# Patient Record
Sex: Female | Born: 1975 | Hispanic: No | Marital: Married | Smoking: Never smoker
Health system: Southern US, Community
[De-identification: ages and names within clinical notes are randomized; demographics above are authoritative.]

## PROBLEM LIST (undated history)

## (undated) DIAGNOSIS — J45909 Unspecified asthma, uncomplicated: Secondary | ICD-10-CM

## (undated) DIAGNOSIS — O871 Deep phlebothrombosis in the puerperium: Secondary | ICD-10-CM

## (undated) DIAGNOSIS — S12100A Unspecified displaced fracture of second cervical vertebra, initial encounter for closed fracture: Secondary | ICD-10-CM

## (undated) DIAGNOSIS — M549 Dorsalgia, unspecified: Secondary | ICD-10-CM

## (undated) HISTORY — DX: Deep phlebothrombosis in the puerperium: O87.1

## (undated) HISTORY — DX: Unspecified asthma, uncomplicated: J45.909

## (undated) HISTORY — PX: NO PAST SURGERIES: SHX2092

---

## 2010-11-06 ENCOUNTER — Emergency Department (HOSPITAL_COMMUNITY): Payer: No Typology Code available for payment source

## 2010-11-06 ENCOUNTER — Inpatient Hospital Stay (HOSPITAL_COMMUNITY)
Admission: EM | Admit: 2010-11-06 | Discharge: 2010-11-13 | DRG: 552 | Disposition: A | Discharge status: Home / Self Care | Payer: No Typology Code available for payment source | Source: Ambulatory Visit | Attending: General Surgery | Admitting: General Surgery

## 2010-11-06 DIAGNOSIS — S12100A Unspecified displaced fracture of second cervical vertebra, initial encounter for closed fracture: Principal | ICD-10-CM | POA: Diagnosis present

## 2010-11-06 DIAGNOSIS — M25519 Pain in unspecified shoulder: Secondary | ICD-10-CM | POA: Diagnosis present

## 2010-11-06 DIAGNOSIS — K5900 Constipation, unspecified: Secondary | ICD-10-CM | POA: Diagnosis present

## 2010-11-06 DIAGNOSIS — Y92410 Unspecified street and highway as the place of occurrence of the external cause: Secondary | ICD-10-CM

## 2010-11-06 DIAGNOSIS — Y998 Other external cause status: Secondary | ICD-10-CM

## 2010-11-06 DIAGNOSIS — S7010XA Contusion of unspecified thigh, initial encounter: Secondary | ICD-10-CM

## 2010-11-06 DIAGNOSIS — M79609 Pain in unspecified limb: Secondary | ICD-10-CM | POA: Diagnosis present

## 2010-11-06 LAB — BASIC METABOLIC PANEL
Calcium: 9.5 mg/dL (ref 8.4–10.5)
Creatinine, Ser: 0.47 mg/dL — ABNORMAL LOW (ref 0.50–1.10)
GFR calc Af Amer: >60 mL/min (ref >60)
Sodium: 137 mEq/L (ref 135–145)

## 2010-11-06 LAB — CBC
MCH: 33.1 pg (ref 26.0–34.0)
MCV: 92.3 fL (ref 78.0–100.0)
Platelets: 304 10*3/uL (ref 150–400)
RDW: 11.3 % — ABNORMAL LOW (ref 11.5–15.5)
WBC: 15.3 10*3/uL — ABNORMAL HIGH (ref 4.0–10.5)

## 2010-11-06 LAB — DIFFERENTIAL
Basophils Relative: 0 % (ref 0–1)
Eosinophils Absolute: 0.1 10*3/uL (ref 0.0–0.7)
Eosinophils Relative: 1 % (ref 0–5)
Lymphs Abs: 0.8 10*3/uL (ref 0.7–4.0)
Monocytes Relative: 4 % (ref 3–12)

## 2010-11-06 LAB — POCT PREGNANCY, URINE: Preg Test, Ur: NEGATIVE

## 2010-11-06 LAB — MRSA PCR SCREENING: MRSA by PCR: NEGATIVE

## 2010-11-06 MED ORDER — IOHEXOL 300 MG/ML  SOLN
100.0000 mL | Freq: Once | INTRAMUSCULAR | Status: AC | PRN
  Administered 2010-11-06: 100 mL via INTRAVENOUS

## 2010-11-07 ENCOUNTER — Inpatient Hospital Stay (HOSPITAL_COMMUNITY): Payer: No Typology Code available for payment source

## 2010-11-08 ENCOUNTER — Inpatient Hospital Stay (HOSPITAL_COMMUNITY): Payer: No Typology Code available for payment source

## 2010-11-10 DIAGNOSIS — S12100A Unspecified displaced fracture of second cervical vertebra, initial encounter for closed fracture: Secondary | ICD-10-CM
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
## 2011-01-26 ENCOUNTER — Encounter: Payer: Self-pay | Admitting: *Deleted

## 2011-01-26 ENCOUNTER — Emergency Department (HOSPITAL_COMMUNITY)
Admission: EM | Admit: 2011-01-26 | Discharge: 2011-01-26 | Disposition: A | Discharge status: Home / Self Care | Payer: Medicaid Other | Source: Home / Self Care | Attending: Family Medicine | Admitting: Family Medicine

## 2011-01-26 DIAGNOSIS — T7840XA Allergy, unspecified, initial encounter: Secondary | ICD-10-CM

## 2011-01-26 MED ORDER — DIPHENHYDRAMINE HCL 50 MG/ML IJ SOLN
50.0000 mg | Freq: Once | INTRAMUSCULAR | Status: AC
  Administered 2011-01-26: 50 mg via INTRAMUSCULAR

## 2011-01-26 MED ORDER — METHYLPREDNISOLONE ACETATE 80 MG/ML IJ SUSP
INTRAMUSCULAR | Status: AC
  Filled 2011-01-26: qty 1

## 2011-01-26 MED ORDER — DIPHENHYDRAMINE HCL 25 MG PO TABS: 25.0000 mg | ORAL_TABLET | Freq: Four times a day (QID) | ORAL | Status: AC

## 2011-01-26 MED ORDER — DIPHENHYDRAMINE HCL 50 MG/ML IJ SOLN
INTRAMUSCULAR | Status: AC
  Filled 2011-01-26: qty 1

## 2011-01-26 MED ORDER — METHYLPREDNISOLONE ACETATE 40 MG/ML IJ SUSP
80.0000 mg | Freq: Once | INTRAMUSCULAR | Status: AC
  Administered 2011-01-26: 80 mg via INTRAMUSCULAR
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
## 2011-01-26 NOTE — ED Provider Notes (Addendum)
History     CSN: [id number] Arrival date & time: 01/26/2011  7:38 PM   First MD Initiated Contact with Patient 01/26/11 1815      Chief Complaint  Patient presents with  . Allergic Reaction    (Consider location/radiation/quality/duration/timing/severity/associated sxs/prior treatment) Patient is a 35 y.o. female presenting with allergic reaction. The history is provided by the patient and a relative. The history is limited by a language barrier. A language interpreter was used.  Allergic Reaction The primary symptoms are  rash and urticaria. The current episode started yesterday. The problem has not changed since onset.This is a new problem.  The rash is associated with itching.  Significant symptoms also include flushing and itching.    History reviewed. No pertinent past medical history.  History reviewed. No pertinent past surgical history.  No family history on file.  History  Substance Use Topics  . Smoking status: Not on file  . Smokeless tobacco: Not on file  . Alcohol Use: Not on file    OB History    Grav Para Term Preterm Abortions TAB SAB Ect Mult Living                  Review of Systems  Constitutional: Negative.   HENT: Negative.   Respiratory: Negative.   Cardiovascular: Negative.   Gastrointestinal: Negative.   Skin: Positive for flushing, itching and rash.    Allergies  Review of patient's allergies indicates no known allergies.  Home Medications  No current outpatient prescriptions on file.  BP 106/56  Pulse 95  Temp(Src) 98.9 F (37.2 C) (Oral)  SpO2 98%  LMP 01/12/2011  Physical Exam  Nursing note and vitals reviewed. Constitutional: She appears well-developed and well-nourished. She is cooperative.    HENT:  Head: Normocephalic.  Right Ear: External ear normal.  Left Ear: External ear normal.  Mouth/Throat: Oropharynx is clear and moist.  Eyes: Pupils are equal, round, and reactive to light.  Neck: Normal range of motion.  Neck supple.  Cardiovascular: Normal rate, regular rhythm, normal heart sounds and intact distal pulses.   Pulmonary/Chest: Effort normal and breath sounds normal.  Neurological: She is alert.  Skin: Skin is warm and dry. Rash noted. There is erythema.    ED Course  Procedures (including critical care time)  Labs Reviewed - No data to display No results found.   No diagnosis found.    MDM          Barkley Bruns, MD 01/26/11 2010  Barkley Bruns, MD 01/26/11 2011

## 2011-02-02 ENCOUNTER — Emergency Department (INDEPENDENT_AMBULATORY_CARE_PROVIDER_SITE_OTHER)
Admission: EM | Admit: 2011-02-02 | Discharge: 2011-02-02 | Disposition: A | Discharge status: Home / Self Care | Payer: Medicaid Other | Source: Home / Self Care | Attending: Emergency Medicine | Admitting: Emergency Medicine

## 2011-02-02 ENCOUNTER — Encounter: Payer: Self-pay | Admitting: Emergency Medicine

## 2011-02-02 DIAGNOSIS — K649 Unspecified hemorrhoids: Secondary | ICD-10-CM

## 2011-02-02 MED ORDER — HYDROCORTISONE ACE-PRAMOXINE 1-1 % RE CREA: TOPICAL_CREAM | Freq: Two times a day (BID) | RECTAL | Status: AC

## 2011-02-02 MED ORDER — LACTULOSE 10 GM/15ML PO SOLN: 20.0000 g | Freq: Three times a day (TID) | ORAL | Status: AC

## 2011-02-02 NOTE — ED Notes (Signed)
Pt here with c/o lower back pain and poss hemroids x 1wk  According to [employer] translation line for [place] language.pt denies constipation and last bm yesterday and normal.pt has been having ongoing rectal itching and pain with bm's.no bleeding reported or fevers.
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
## 2011-02-04 ENCOUNTER — Encounter: Payer: Self-pay | Admitting: *Deleted

## 2011-02-04 ENCOUNTER — Telehealth (HOSPITAL_COMMUNITY): Payer: Self-pay | Admitting: *Deleted

## 2011-02-07 ENCOUNTER — Telehealth (HOSPITAL_COMMUNITY): Payer: Self-pay | Admitting: *Deleted

## 2011-02-07 NOTE — ED Notes (Signed)
Dr. Lorenz Coaster notified of these telephone encounters with Sierra Blanchard from [facility] and Administrator.  He is aware there will be a delay of pt. following up with the GI doctor due to [facility] requiring referral from PCP before they will pay for the visit. [name] 02/07/2011

## 2011-02-20 ENCOUNTER — Encounter (HOSPITAL_COMMUNITY): Payer: Self-pay

## 2011-02-20 ENCOUNTER — Emergency Department (INDEPENDENT_AMBULATORY_CARE_PROVIDER_SITE_OTHER)
Admission: EM | Admit: 2011-02-20 | Discharge: 2011-02-20 | Disposition: A | Discharge status: Home / Self Care | Payer: Medicaid Other | Source: Home / Self Care | Attending: Family Medicine | Admitting: Family Medicine

## 2011-02-20 DIAGNOSIS — H1010 Acute atopic conjunctivitis, unspecified eye: Secondary | ICD-10-CM

## 2011-02-20 DIAGNOSIS — H1045 Other chronic allergic conjunctivitis: Secondary | ICD-10-CM

## 2011-02-20 MED ORDER — LORATADINE 10 MG PO TABS: 10.0000 mg | ORAL_TABLET | Freq: Every day | ORAL | Status: DC
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
## 2011-02-20 NOTE — ED Notes (Signed)
Pt states per [facility] interpreter that she has had eyes itching and pain for several months and had similar symptoms in [place] and used eye gtts.

## 2011-03-08 ENCOUNTER — Emergency Department (INDEPENDENT_AMBULATORY_CARE_PROVIDER_SITE_OTHER)
Admission: EM | Admit: 2011-03-08 | Discharge: 2011-03-08 | Disposition: A | Discharge status: Home / Self Care | Payer: Medicaid Other | Source: Home / Self Care | Attending: Family Medicine | Admitting: Family Medicine

## 2011-03-08 ENCOUNTER — Encounter (HOSPITAL_COMMUNITY): Payer: Self-pay

## 2011-03-08 DIAGNOSIS — J309 Allergic rhinitis, unspecified: Secondary | ICD-10-CM

## 2011-03-08 DIAGNOSIS — L309 Dermatitis, unspecified: Secondary | ICD-10-CM

## 2011-03-08 DIAGNOSIS — L259 Unspecified contact dermatitis, unspecified cause: Secondary | ICD-10-CM

## 2011-03-08 DIAGNOSIS — J302 Other seasonal allergic rhinitis: Secondary | ICD-10-CM

## 2011-03-08 HISTORY — DX: Dorsalgia, unspecified: M54.9

## 2011-03-08 MED ORDER — LORATADINE 10 MG PO TABS: 10.0000 mg | ORAL_TABLET | Freq: Every day | ORAL | Status: DC

## 2011-03-08 MED ORDER — TRIAMCINOLONE ACETONIDE 0.05 % EX OINT: 1.0000 "application " | TOPICAL_OINTMENT | Freq: Two times a day (BID) | CUTANEOUS | Status: DC | PRN

## 2011-03-08 NOTE — ED Notes (Signed)
Information obtained via [employer].  Mother states she is having itching to face and body for 2 days.  States she thinks it is an allergic reaction to something. Reports she had similar sx 2 months ago and sx resolved with "medication".
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
## 2011-05-30 ENCOUNTER — Encounter (HOSPITAL_COMMUNITY): Payer: Self-pay

## 2011-05-30 ENCOUNTER — Emergency Department (INDEPENDENT_AMBULATORY_CARE_PROVIDER_SITE_OTHER)
Admission: EM | Admit: 2011-05-30 | Discharge: 2011-05-30 | Disposition: A | Discharge status: Home / Self Care | Payer: Medicaid Other | Source: Home / Self Care | Attending: Emergency Medicine | Admitting: Emergency Medicine

## 2011-05-30 DIAGNOSIS — M542 Cervicalgia: Secondary | ICD-10-CM

## 2011-05-30 HISTORY — DX: Unspecified displaced fracture of second cervical vertebra, initial encounter for closed fracture: S12.100A

## 2011-05-30 MED ORDER — HYDROCODONE-ACETAMINOPHEN 5-500 MG PO TABS: 1.0000 | ORAL_TABLET | Freq: Four times a day (QID) | ORAL | Status: AC | PRN

## 2011-05-30 NOTE — ED Notes (Signed)
Limited eval, as does not speak english; phone available for translation
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
## 2011-07-29 ENCOUNTER — Emergency Department (HOSPITAL_COMMUNITY)
Admission: EM | Admit: 2011-07-29 | Discharge: 2011-07-29 | Disposition: A | Discharge status: Home / Self Care | Payer: Medicaid Other | Source: Home / Self Care | Attending: Emergency Medicine | Admitting: Emergency Medicine

## 2011-07-29 ENCOUNTER — Encounter (HOSPITAL_COMMUNITY): Payer: Self-pay | Admitting: *Deleted

## 2011-07-29 ENCOUNTER — Emergency Department (INDEPENDENT_AMBULATORY_CARE_PROVIDER_SITE_OTHER): Payer: Medicaid Other

## 2011-07-29 DIAGNOSIS — G8929 Other chronic pain: Secondary | ICD-10-CM

## 2011-07-29 DIAGNOSIS — M542 Cervicalgia: Secondary | ICD-10-CM

## 2011-07-29 DIAGNOSIS — M62838 Other muscle spasm: Secondary | ICD-10-CM

## 2011-07-29 MED ORDER — CYCLOBENZAPRINE HCL 10 MG PO TABS: 10.0000 mg | ORAL_TABLET | Freq: Three times a day (TID) | ORAL | Status: AC | PRN

## 2011-07-29 MED ORDER — MELOXICAM 15 MG PO TABS: 15.0000 mg | ORAL_TABLET | Freq: Every day | ORAL | Status: DC

## 2011-07-29 MED ORDER — HYDROCODONE-ACETAMINOPHEN 5-325 MG PO TABS: 2.0000 | ORAL_TABLET | ORAL | Status: AC | PRN
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
## 2011-07-29 NOTE — Discharge Instructions (Signed)
You will need to followup with a spine surgeon for ongoing management. Return if you've a fever above 100.4, if you get worse, or other concerns.
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
## 2011-09-01 ENCOUNTER — Emergency Department (HOSPITAL_COMMUNITY)
Admission: EM | Admit: 2011-09-01 | Discharge: 2011-09-01 | Disposition: A | Discharge status: Home / Self Care | Payer: Medicaid Other | Source: Home / Self Care | Attending: Family Medicine | Admitting: Family Medicine

## 2011-09-01 ENCOUNTER — Encounter (HOSPITAL_COMMUNITY): Payer: Self-pay

## 2011-09-01 DIAGNOSIS — R5381 Other malaise: Secondary | ICD-10-CM

## 2011-09-01 DIAGNOSIS — M542 Cervicalgia: Secondary | ICD-10-CM

## 2011-09-01 DIAGNOSIS — G8929 Other chronic pain: Secondary | ICD-10-CM

## 2011-09-01 DIAGNOSIS — R5383 Other fatigue: Secondary | ICD-10-CM

## 2011-09-01 LAB — COMPREHENSIVE METABOLIC PANEL
AST: 21 U/L (ref 0–37)
Albumin: 4.3 g/dL (ref 3.5–5.2)
BUN: 9 mg/dL (ref 6–23)
Calcium: 9.9 mg/dL (ref 8.4–10.5)
Creatinine, Ser: 0.52 mg/dL (ref 0.50–1.10)
Total Protein: 8 g/dL (ref 6.0–8.3)

## 2011-09-01 LAB — CBC
HCT: 38.3 % (ref 36.0–46.0)
MCHC: 34.7 g/dL (ref 30.0–36.0)
MCV: 92.1 fL (ref 78.0–100.0)
Platelets: 163 10*3/uL (ref 150–400)
RDW: 12.2 % (ref 11.5–15.5)

## 2011-09-01 LAB — TSH: TSH: 0.988 u[IU]/mL (ref 0.350–4.500)

## 2011-09-01 MED ORDER — IBUPROFEN 600 MG PO TABS: 600.0000 mg | ORAL_TABLET | Freq: Three times a day (TID) | ORAL | Status: AC | PRN

## 2011-09-01 MED ORDER — PRENATAL VITAMINS 28-0.8 MG PO TABS: 1.0000 | ORAL_TABLET | Freq: Every day | ORAL | Status: DC
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
## 2013-01-31 ENCOUNTER — Encounter: Payer: Self-pay | Admitting: *Deleted

## 2013-02-05 ENCOUNTER — Ambulatory Visit (INDEPENDENT_AMBULATORY_CARE_PROVIDER_SITE_OTHER): Payer: Medicaid Other | Admitting: Obstetrics and Gynecology

## 2013-02-05 ENCOUNTER — Encounter: Payer: Self-pay | Admitting: Obstetrics and Gynecology

## 2013-02-05 ENCOUNTER — Encounter: Payer: Self-pay | Admitting: *Deleted

## 2013-02-05 ENCOUNTER — Other Ambulatory Visit (HOSPITAL_COMMUNITY)
Admission: RE | Admit: 2013-02-05 | Discharge: 2013-02-05 | Disposition: A | Discharge status: Home / Self Care | Payer: Medicaid Other | Source: Ambulatory Visit | Attending: Obstetrics and Gynecology | Admitting: Obstetrics and Gynecology

## 2013-02-05 VITALS — BP 144/88 | Temp 98.1°F | Ht <= 58 in | Wt 140.0 lb

## 2013-02-05 DIAGNOSIS — O0930 Supervision of pregnancy with insufficient antenatal care, unspecified trimester: Secondary | ICD-10-CM | POA: Insufficient documentation

## 2013-02-05 DIAGNOSIS — Z113 Encounter for screening for infections with a predominantly sexual mode of transmission: Secondary | ICD-10-CM | POA: Insufficient documentation

## 2013-02-05 DIAGNOSIS — Z1151 Encounter for screening for human papillomavirus (HPV): Secondary | ICD-10-CM | POA: Insufficient documentation

## 2013-02-05 DIAGNOSIS — J45909 Unspecified asthma, uncomplicated: Secondary | ICD-10-CM | POA: Insufficient documentation

## 2013-02-05 DIAGNOSIS — Z3483 Encounter for supervision of other normal pregnancy, third trimester: Secondary | ICD-10-CM

## 2013-02-05 DIAGNOSIS — Z01419 Encounter for gynecological examination (general) (routine) without abnormal findings: Secondary | ICD-10-CM | POA: Insufficient documentation

## 2013-02-05 DIAGNOSIS — O09529 Supervision of elderly multigravida, unspecified trimester: Secondary | ICD-10-CM | POA: Insufficient documentation

## 2013-02-05 DIAGNOSIS — O0933 Supervision of pregnancy with insufficient antenatal care, third trimester: Secondary | ICD-10-CM | POA: Insufficient documentation

## 2013-02-05 DIAGNOSIS — O09523 Supervision of elderly multigravida, third trimester: Secondary | ICD-10-CM

## 2013-02-05 DIAGNOSIS — Z23 Encounter for immunization: Secondary | ICD-10-CM

## 2013-02-05 LAB — POCT URINALYSIS DIP (DEVICE)
Bilirubin Urine: NEGATIVE
Glucose, UA: NEGATIVE mg/dL
Hgb urine dipstick: NEGATIVE
Leukocytes, UA: NEGATIVE
Nitrite: NEGATIVE

## 2013-02-05 MED ORDER — TETANUS-DIPHTH-ACELL PERTUSSIS 5-2.5-18.5 LF-MCG/0.5 IM SUSP: 0.5000 mL | Freq: Once | INTRAMUSCULAR | Status: DC

## 2013-02-05 NOTE — Addendum Note (Signed)
Addended by: Faythe Casa on: 02/05/2013 11:29 AM   Modules accepted: Orders
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
## 2013-02-05 NOTE — Progress Notes (Signed)
P= 77, Used Interpreter. Given new patient information. Discussed bmi/appropriate weight gain. C/o feeling hot.

## 2013-02-05 NOTE — Addendum Note (Signed)
Addended by: Franchot Mimes on: 02/05/2013 11:06 AM   Modules accepted: Orders

## 2013-02-06 ENCOUNTER — Encounter: Payer: Self-pay | Admitting: Obstetrics and Gynecology

## 2013-02-06 LAB — PRESCRIPTION MONITORING PROFILE (19 PANEL)
Benzodiazepine Screen, Urine: NEGATIVE ng/mL
Buprenorphine, Urine: NEGATIVE ng/mL
Cocaine Metabolites: NEGATIVE ng/mL
Fentanyl, Ur: NEGATIVE ng/mL
MDMA URINE: NEGATIVE ng/mL
Meperidine, Ur: NEGATIVE ng/mL
Methaqualone: NEGATIVE ng/mL
Zolpidem, Urine: NEGATIVE ng/mL

## 2013-02-06 LAB — OBSTETRIC PANEL
Antibody Screen: NEGATIVE
Basophils Absolute: 0 10*3/uL (ref 0.0–0.1)
HCT: 32.6 % — ABNORMAL LOW (ref 36.0–46.0)
Hemoglobin: 10.9 g/dL — ABNORMAL LOW (ref 12.0–15.0)
Lymphocytes Relative: 14 % (ref 12–46)
Lymphs Abs: 1.3 10*3/uL (ref 0.7–4.0)
Monocytes Absolute: 0.5 10*3/uL (ref 0.1–1.0)
Monocytes Relative: 6 % (ref 3–12)
Neutro Abs: 6.2 10*3/uL (ref 1.7–7.7)
RBC: 3.49 MIL/uL — ABNORMAL LOW (ref 3.87–5.11)
RDW: 12.5 % (ref 11.5–15.5)
Rubella: 2.81 Index — ABNORMAL HIGH (ref <0.90)
WBC: 9.3 10*3/uL (ref 4.0–10.5)

## 2013-02-07 LAB — HEMOGLOBINOPATHY EVALUATION
Hemoglobin Other: 0 %
Hgb S Quant: 0 %

## 2013-02-08 ENCOUNTER — Other Ambulatory Visit: Payer: Self-pay | Admitting: Family Medicine

## 2013-02-08 DIAGNOSIS — O09529 Supervision of elderly multigravida, unspecified trimester: Secondary | ICD-10-CM

## 2013-02-08 LAB — CULTURE, OB URINE: Colony Count: 75000

## 2013-02-12 ENCOUNTER — Ambulatory Visit (HOSPITAL_COMMUNITY)
Admission: RE | Admit: 2013-02-12 | Discharge: 2013-02-12 | Disposition: A | Discharge status: Home / Self Care | Payer: Medicaid Other | Source: Ambulatory Visit | Attending: Family Medicine | Admitting: Family Medicine

## 2013-02-12 ENCOUNTER — Other Ambulatory Visit: Payer: Self-pay | Admitting: Family Medicine

## 2013-02-12 ENCOUNTER — Ambulatory Visit (HOSPITAL_COMMUNITY)
Admission: RE | Admit: 2013-02-12 | Discharge: 2013-02-12 | Disposition: A | Discharge status: Home / Self Care | Payer: Medicaid Other | Source: Ambulatory Visit | Attending: Obstetrics and Gynecology | Admitting: Obstetrics and Gynecology

## 2013-02-12 VITALS — BP 130/62 | HR 97 | Wt 143.0 lb

## 2013-02-12 DIAGNOSIS — O09523 Supervision of elderly multigravida, third trimester: Secondary | ICD-10-CM

## 2013-02-12 DIAGNOSIS — O0933 Supervision of pregnancy with insufficient antenatal care, third trimester: Secondary | ICD-10-CM

## 2013-02-12 DIAGNOSIS — O0930 Supervision of pregnancy with insufficient antenatal care, unspecified trimester: Secondary | ICD-10-CM | POA: Insufficient documentation

## 2013-02-12 DIAGNOSIS — O358XX0 Maternal care for other (suspected) fetal abnormality and damage, not applicable or unspecified: Secondary | ICD-10-CM | POA: Insufficient documentation

## 2013-02-12 DIAGNOSIS — Z363 Encounter for antenatal screening for malformations: Secondary | ICD-10-CM | POA: Insufficient documentation

## 2013-02-12 DIAGNOSIS — O09529 Supervision of elderly multigravida, unspecified trimester: Secondary | ICD-10-CM

## 2013-02-12 DIAGNOSIS — Z1389 Encounter for screening for other disorder: Secondary | ICD-10-CM | POA: Insufficient documentation

## 2013-02-12 NOTE — Progress Notes (Signed)
Genetic Counseling  High-Risk Gestation Note  Appointment Date:  02/12/2013 Referred By: Catalina Antigua, MD Date of Birth:  08/24/1975    Pregnancy History: [id number] Estimated Date of Delivery: 03/31/13 Estimated Gestational Age: [redacted]w[redacted]d Attending: Alpha Gula, MD   [name] was seen for genetic counseling because of a maternal age of 37 y.o..   Sport and exercise psychologist, Sierra Blanchard, provided interpretation for today's visit.   She was counseled regarding maternal age and the association with risk for chromosome conditions due to nondisjunction with aging of the ova.   We reviewed chromosomes, nondisjunction, and the associated 1 in 72 risk for fetal aneuploidy related to a maternal age of 37 y.o. at delivery. We specifically discussed Down syndrome (trisomy 80), trisomies 37 and 12, and sex chromosome aneuploidies (47,XXX and 47,XXY) including the common features and prognoses of each.   We reviewed available screening options including noninvasive prenatal screening (NIPS)/cell free fetal DNA (cffDNA) testing and detailed ultrasound.  She was counseled that screening tests are used to modify a patient's a priori risk for aneuploidy, typically based on age. This estimate provides a pregnancy specific risk assessment. We reviewed the benefits and limitations of each option. Specifically, we discussed the conditions for which each test screens, the detection rates, and false positive rates of each. She was also counseled regarding diagnostic testing via amniocentesis. We reviewed the approximate 1 in 300-500 risk for complications for amniocentesis, including spontaneous preterm labor. After consideration of all the options, she declined NIPS and amniocentesis and elected to proceed with ultrasound only.    A complete ultrasound was performed today. The ultrasound report will be sent under separate cover. There were no visualized fetal anomalies or markers suggestive of aneuploidy though  fetal anatomic survey limited by late gestational age. She understands that screening tests cannot rule out all birth defects or genetic syndromes.   Both family histories were reviewed and found to be noncontributory for birth defects, intellectual disability, and known genetic conditions. Without further information regarding the provided family history, an accurate genetic risk cannot be calculated. Further genetic counseling is warranted if more information is obtained.  [name] denied exposure to environmental toxins or chemical agents. She denied the use of alcohol, tobacco or street drugs. She denied significant viral illnesses during the course of her pregnancy. Her medical and surgical histories were noncontributory.   I counseled [name] regarding the above risks and available options.  The approximate face-to-face time with the genetic counselor was 30 minutes.  Quinn Plowman, MS,  Certified Genetic Counselor 02/12/2013

## 2013-02-12 NOTE — Progress Notes (Signed)
Sierra Blanchard  was seen today for an ultrasound appointment.  See full report in [facility].  Impression: Single IUP at 33 2/7 weeks by dates and 35 4/7 weeks by ultrasounnd today Late onset of prenatal care, advanced maternal age Somewhat limited views of the fetal anatomy obtained due to late gestational age No gross anomalies noted The estimated fetal weight today is > 90th %tile.  Uncertain whether or not this represents true fetal macrosomia or a dating issue Active fetus with BPP of 8/8 The amniotic fluid volume is subjectively decreased (AFI 8.3 cm)  Recommendations: Recommend limited ultrasound in next week for amniotic fluid volume assessement. Consider follow up ultrasuond for interval growth in 3-4 weeks if undelivered due to dating/ size discrepancy  Alpha Gula, MD

## 2013-02-18 ENCOUNTER — Encounter: Payer: Self-pay | Admitting: *Deleted

## 2013-02-19 ENCOUNTER — Ambulatory Visit (HOSPITAL_COMMUNITY)
Admission: RE | Admit: 2013-02-19 | Discharge: 2013-02-19 | Disposition: A | Discharge status: Home / Self Care | Payer: Medicaid Other | Source: Ambulatory Visit | Attending: Internal Medicine | Admitting: Internal Medicine

## 2013-02-19 ENCOUNTER — Inpatient Hospital Stay (HOSPITAL_COMMUNITY)
Admission: AD | Admit: 2013-02-19 | Discharge: 2013-02-19 | Disposition: A | Discharge status: Home / Self Care | Payer: Medicaid Other | Source: Ambulatory Visit | Attending: Obstetrics & Gynecology | Admitting: Obstetrics & Gynecology

## 2013-02-19 ENCOUNTER — Other Ambulatory Visit: Payer: Self-pay | Admitting: Advanced Practice Midwife

## 2013-02-19 ENCOUNTER — Encounter: Payer: Medicaid Other | Admitting: Family Medicine

## 2013-02-19 ENCOUNTER — Encounter: Payer: Self-pay | Admitting: Family Medicine

## 2013-02-19 ENCOUNTER — Encounter (HOSPITAL_COMMUNITY): Payer: Self-pay | Admitting: Advanced Practice Midwife

## 2013-02-19 DIAGNOSIS — O99891 Other specified diseases and conditions complicating pregnancy: Secondary | ICD-10-CM | POA: Insufficient documentation

## 2013-02-19 DIAGNOSIS — O0933 Supervision of pregnancy with insufficient antenatal care, third trimester: Secondary | ICD-10-CM

## 2013-02-19 DIAGNOSIS — O09523 Supervision of elderly multigravida, third trimester: Secondary | ICD-10-CM

## 2013-02-19 DIAGNOSIS — O4100X0 Oligohydramnios, unspecified trimester, not applicable or unspecified: Secondary | ICD-10-CM | POA: Insufficient documentation

## 2013-02-19 DIAGNOSIS — O4700 False labor before 37 completed weeks of gestation, unspecified trimester: Secondary | ICD-10-CM | POA: Insufficient documentation

## 2013-02-19 DIAGNOSIS — N949 Unspecified condition associated with female genital organs and menstrual cycle: Secondary | ICD-10-CM

## 2013-02-19 DIAGNOSIS — R1011 Right upper quadrant pain: Secondary | ICD-10-CM | POA: Insufficient documentation

## 2013-02-19 LAB — GROUP B STREP BY PCR: Group B strep by PCR: NEGATIVE

## 2013-02-19 LAB — URINALYSIS, ROUTINE W REFLEX MICROSCOPIC
Bilirubin Urine: NEGATIVE
Glucose, UA: NEGATIVE mg/dL
Ketones, ur: NEGATIVE mg/dL
pH: 7 (ref 5.0–8.0)

## 2013-02-19 NOTE — MAU Note (Signed)
Patient is sent from ultrasound due to her c/o vaginal pressure and contractions. She denies vaginal bleeding or LOF. Reports good fetal movement.
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
## 2013-02-20 ENCOUNTER — Telehealth: Payer: Self-pay | Admitting: *Deleted

## 2013-02-20 NOTE — Telephone Encounter (Addendum)
Message copied by Jill Side on Wed Feb 20, 2013 11:51 AM ------      Message from: Reva Bores      Created: Tue Feb 19, 2013  1:46 PM       Low fluid--needs to begin 2x/wk testing.   ------  Called pt with [facility] Interpreter [phone]. I informed her that her [place] yesterday shows there is low amount of fluid around the baby. We need to follow this closely and she needs clinic appt on 12/30 @ 0900.  Also, if she notices decreased fetal movement she should come to the hospital for evaluation.  Pt agreed to clinic appt and voiced understanding.

## 2013-02-20 NOTE — MAU Provider Note (Signed)
Attestation of Attending Supervision of Advanced Practitioner (PA/CNM/NP): Evaluation and management procedures were performed by the Advanced Practitioner under my supervision and collaboration.  I have reviewed the Advanced Practitioner's note and chart, and I agree with the management and plan.  Lashona Schaaf, MD, FACOG Attending Obstetrician & Gynecologist Faculty Practice, [facility]  

## 2013-02-26 ENCOUNTER — Encounter: Payer: Self-pay | Admitting: Obstetrics & Gynecology

## 2013-02-26 ENCOUNTER — Ambulatory Visit (HOSPITAL_COMMUNITY)
Admission: RE | Admit: 2013-02-26 | Discharge: 2013-02-26 | Disposition: A | Discharge status: Home / Self Care | Payer: Medicaid Other | Source: Ambulatory Visit | Attending: Obstetrics & Gynecology | Admitting: Obstetrics & Gynecology

## 2013-02-26 ENCOUNTER — Ambulatory Visit (INDEPENDENT_AMBULATORY_CARE_PROVIDER_SITE_OTHER): Payer: Medicaid Other | Admitting: Obstetrics & Gynecology

## 2013-02-26 VITALS — BP 129/76 | Wt 141.8 lb

## 2013-02-26 DIAGNOSIS — O4103X0 Oligohydramnios, third trimester, not applicable or unspecified: Secondary | ICD-10-CM

## 2013-02-26 DIAGNOSIS — O4100X0 Oligohydramnios, unspecified trimester, not applicable or unspecified: Secondary | ICD-10-CM

## 2013-02-26 LAB — POCT URINALYSIS DIP (DEVICE)
Glucose, UA: NEGATIVE mg/dL
Hgb urine dipstick: NEGATIVE
Ketones, ur: NEGATIVE mg/dL
Nitrite: NEGATIVE
Specific Gravity, Urine: 1.015 (ref 1.005–1.030)
pH: 7 (ref 5.0–8.0)

## 2013-02-26 NOTE — Progress Notes (Signed)
NST reviewed and reactive.  Last AFI 12/23 was 8.  Needs AFI today +FM, No VB, No ctx  Alejah Aristizabal L. Harraway-Smith, M.D., Evern Core

## 2013-02-26 NOTE — Patient Instructions (Signed)
Oligohydramnios °An unborn baby (fetus) lives in the mother's uterus in a sac of amniotic fluid. This fluid: °· Protects the fetus from trauma. °· Helps the fetus move freely inside the uterus. °· Helps the fetal lungs, kidneys, and digestive system develop. °· Protects the baby from infections.   °Oligohydramnios is when there is not enough amniotic fluid in the amniotic sac. If this happens early in pregnancy, a fetus might not develop normally. If this happens in the second half of a pregnancy, a fetus might not grow as much as it should and could cause problems during delivery.   °Oligohydramnios can also cause: °· Pregnancy loss (miscarriage). °· Premature birth. °· Deformities of the face or body. °· Problems with muscles and bones. °· Pressure and compression on the umbilical cord, which decreases oxygen to the fetus. °· Lung problems. °· Stillbirth. °CAUSES °A cause cannot always be found. However, possible causes include: °· A leak or a tear in the amniotic sac. °· A problem with the placenta. °· Having identical twins who share the same placenta. °· A fetal birth defect. This is usually something in the fetal kidneys or urinary tract that has not developed as it should. °· A pregnancy that goes past the due date. °· Something that affects the mother's body, such as: °· Dehydration. °· High blood pressure. °· Diabetes. °· Some medications (examples include ibuprofen and blood pressure medicines).  °· A disease that affects the skin, joints, kidneys and other organs (systemic lupus). °· Birth defects. °SYMPTOMS °· There may be no symptoms.  °· Leaking fluid from the vagina. °· Measuring smaller than usual uterus at a routine pregnancy exam. °DIAGNOSIS °To diagnose and evaluate oligohydramnios, your caregiver may: °· Order a prenatal ultrasound test. This test: °· Measures the amniotic fluid level. This will show if the amount of fluid is right for the stage of pregnancy. °· Checks the fetal  kidneys. °· Checks fetal growth. °· Evaluates the placenta. °· Confirm that you broke your water, if this is suspected by your caregiver. °· Order a nonstress test. This noninvasive test is an assessment of fetal well-being. It monitors the fetal heart rate patterns over a 20 minute period. °· Order a biophysical profile. This test measures and evaluates 5 observations of the baby (results of nonstress testing, fetal breathing, movements, muscle tone, and amount of amniotic fluid). °· Assess fetal kick counts. Tell your caregiver if the baby appears to become less active. °· Order a uterine artery doppler study. This is a type of ultrasound. It can show if enough blood and nourishment are getting to the fetus through the placenta and umbilical cord. °· Check your blood pressure. °· Check your blood sugar. °TREATMENT °Treatment will depend on how low the fluid is, how far along in the pregnancy you are, and your overall health. Treatment options include: °· Watching and waiting. You will need to be checked more often than normal. °· Increasing your fluid intake. This may be done by mouth, or you might get the fluids through the vein (intravenously, IV). °· Delivering the baby if recommended by your caregiver. °HOME CARE INSTRUCTIONS °· Only take medicine as directed by your caregiver, especially if you have a medical problem (diabetes, high blood pressure). °· Follow your caregiver's instructions regarding physical activity, especially if you have a medical problem (diabetes, high blood pressure). °· Keep all prenatal care appointments. °· Rest as much as possible. Your caregiver may put you on bed rest. °· Drink enough fluids to keep your urine clear or pale   yellow. °· Eat a healthy and nourishing diet. °· Do not smoke, drink alcohol, or take illegal drugs.  °SEEK MEDICAL CARE IF: °· You have any questions or worries about your pregnancy. °· You notice a decrease in fetal movement. °SEEK IMMEDIATE MEDICAL CARE IF:   °· Fluid comes out of your vagina. °· You start to have labor pains (contractions). °· You have a fever. °Document Released: 06/01/2010 Document Revised: 05/09/2011 Document Reviewed: 06/01/2010 °ExitCare® Patient Information ©2014 [employer]. ° °

## 2013-02-26 NOTE — Progress Notes (Signed)
p=[phone] 

## 2013-02-27 ENCOUNTER — Encounter: Payer: Medicaid Other | Admitting: Obstetrics & Gynecology

## 2013-02-28 NOTE — L&D Delivery Note (Signed)
Attestation of Attending Supervision of Advanced Practitioner (PA/CNM/NP): Evaluation and management procedures were performed by the Advanced Practitioner under my supervision and collaboration.  I have reviewed the Advanced Practitioner's note and chart, and I agree with the management and plan.  Gerome Kokesh, MD, FACOG Attending Obstetrician & Gynecologist Faculty Practice, [facility]  

## 2013-02-28 NOTE — L&D Delivery Note (Signed)
Delivery Note At 3:29 AM a viable and healthy female was delivered via  (Presentation: LOA ).  APGAR: , ; weight .   Placenta status:spontaneous and grossly intact with 3 vessel Cord:  with the following complications: terminal meconium  Anesthesia:  none Episiotomy: none Lacerations: none Suture Repair: n/a Est. Blood Loss (mL): 200  Mom to postpartum.  Baby to Couplet care / Skin to Skin.  [facility],Sierra Costanzo 03/01/2013, 3:46 AM

## 2013-03-01 ENCOUNTER — Encounter (HOSPITAL_COMMUNITY): Payer: Self-pay | Admitting: Advanced Practice Midwife

## 2013-03-01 ENCOUNTER — Inpatient Hospital Stay (HOSPITAL_COMMUNITY)
Admission: AD | Admit: 2013-03-01 | Discharge: 2013-03-02 | DRG: 774 | Disposition: A | Discharge status: Home / Self Care | Payer: Medicaid Other | Source: Ambulatory Visit | Attending: Obstetrics & Gynecology | Admitting: Obstetrics & Gynecology

## 2013-03-01 DIAGNOSIS — O09529 Supervision of elderly multigravida, unspecified trimester: Secondary | ICD-10-CM | POA: Diagnosis present

## 2013-03-01 DIAGNOSIS — Z758 Other problems related to medical facilities and other health care: Secondary | ICD-10-CM

## 2013-03-01 DIAGNOSIS — O4100X0 Oligohydramnios, unspecified trimester, not applicable or unspecified: Secondary | ICD-10-CM

## 2013-03-01 DIAGNOSIS — IMO0001 Reserved for inherently not codable concepts without codable children: Secondary | ICD-10-CM

## 2013-03-01 DIAGNOSIS — Z789 Other specified health status: Secondary | ICD-10-CM

## 2013-03-01 LAB — CBC
HCT: 31.9 % — ABNORMAL LOW (ref 36.0–46.0)
HCT: 24.5 % — ABNORMAL LOW (ref 36.0–46.0)
HEMATOCRIT: 20.6 % — AB (ref 36.0–46.0)
HEMOGLOBIN: 7 g/dL — AB (ref 12.0–15.0)
Hemoglobin: 10.8 g/dL — ABNORMAL LOW (ref 12.0–15.0)
Hemoglobin: 8.2 g/dL — ABNORMAL LOW (ref 12.0–15.0)
MCH: 30.7 pg (ref 26.0–34.0)
MCH: 30.8 pg (ref 26.0–34.0)
MCH: 31 pg (ref 26.0–34.0)
MCHC: 33.9 g/dL (ref 30.0–36.0)
MCHC: 33.5 g/dL (ref 30.0–36.0)
MCHC: 34 g/dL (ref 30.0–36.0)
MCV: 90.6 fL (ref 78.0–100.0)
MCV: 92.1 fL (ref 78.0–100.0)
MCV: 91.2 fL (ref 78.0–100.0)
Platelets: 247 10*3/uL (ref 150–400)
Platelets: 202 10*3/uL (ref 150–400)
Platelets: 170 10*3/uL (ref 150–400)
RBC: 3.52 MIL/uL — ABNORMAL LOW (ref 3.87–5.11)
RBC: 2.66 MIL/uL — AB (ref 3.87–5.11)
RBC: 2.26 MIL/uL — AB (ref 3.87–5.11)
RDW: 12.8 % (ref 11.5–15.5)
RDW: 12.8 % (ref 11.5–15.5)
RDW: 12.8 % (ref 11.5–15.5)
WBC: 11.4 10*3/uL — AB (ref 4.0–10.5)
WBC: 17.1 10*3/uL — ABNORMAL HIGH (ref 4.0–10.5)
WBC: 13.2 10*3/uL — ABNORMAL HIGH (ref 4.0–10.5)

## 2013-03-01 LAB — ABO/RH: ABO/RH(D): B POS

## 2013-03-01 LAB — DIC (DISSEMINATED INTRAVASCULAR COAGULATION)PANEL
D-Dimer, Quant: 8.33 ug/mL-FEU — ABNORMAL HIGH (ref 0.00–0.48)
INR: 1.06 (ref 0.00–1.49)
Platelets: 198 10*3/uL (ref 150–400)
Smear Review: NONE SEEN
aPTT: 24 seconds (ref 24–37)

## 2013-03-01 LAB — DIC (DISSEMINATED INTRAVASCULAR COAGULATION) PANEL
FIBRINOGEN: 306 mg/dL (ref 204–475)
PROTHROMBIN TIME: 13.6 s (ref 11.6–15.2)

## 2013-03-01 LAB — RPR: RPR: NONREACTIVE

## 2013-03-01 LAB — PREPARE RBC (CROSSMATCH)

## 2013-03-01 MED ORDER — LACTATED RINGERS IV SOLN
INTRAVENOUS | Status: DC
  Administered 2013-03-01 (×2): via INTRAVENOUS

## 2013-03-01 MED ORDER — IBUPROFEN 600 MG PO TABS
600.0000 mg | ORAL_TABLET | Freq: Four times a day (QID) | ORAL | Status: DC
  Administered 2013-03-01 – 2013-03-02 (×5): 600 mg via ORAL
  Filled 2013-03-01 (×5): qty 1

## 2013-03-01 MED ORDER — DIPHENHYDRAMINE HCL 50 MG/ML IJ SOLN: 12.5000 mg | INTRAMUSCULAR | Status: DC | PRN

## 2013-03-01 MED ORDER — ACETAMINOPHEN 325 MG PO TABS
650.0000 mg | ORAL_TABLET | ORAL | Status: DC | PRN
Start: 2013-03-01 — End: 2013-03-01

## 2013-03-01 MED ORDER — LIDOCAINE HCL (PF) 1 % IJ SOLN
30.0000 mL | INTRAMUSCULAR | Status: DC | PRN
  Filled 2013-03-01 (×2): qty 30

## 2013-03-01 MED ORDER — LANOLIN HYDROUS EX OINT: TOPICAL_OINTMENT | CUTANEOUS | Status: DC | PRN

## 2013-03-01 MED ORDER — ZOLPIDEM TARTRATE 5 MG PO TABS: 5.0000 mg | ORAL_TABLET | Freq: Every evening | ORAL | Status: DC | PRN

## 2013-03-01 MED ORDER — EPHEDRINE 5 MG/ML INJ
10.0000 mg | INTRAVENOUS | Status: DC | PRN
  Filled 2013-03-01: qty 2

## 2013-03-01 MED ORDER — LACTATED RINGERS IV SOLN: 500.0000 mL | Freq: Once | INTRAVENOUS | Status: DC

## 2013-03-01 MED ORDER — ONDANSETRON HCL 4 MG/2ML IJ SOLN: 4.0000 mg | INTRAMUSCULAR | Status: DC | PRN

## 2013-03-01 MED ORDER — CITRIC ACID-SODIUM CITRATE 334-500 MG/5ML PO SOLN: 30.0000 mL | ORAL | Status: DC | PRN

## 2013-03-01 MED ORDER — WITCH HAZEL-GLYCERIN EX PADS
1.0000 | MEDICATED_PAD | CUTANEOUS | Status: DC | PRN
Start: 2013-03-01 — End: 2013-03-02

## 2013-03-01 MED ORDER — MISOPROSTOL 200 MCG PO TABS
ORAL_TABLET | ORAL | Status: AC
  Filled 2013-03-01: qty 1

## 2013-03-01 MED ORDER — ONDANSETRON HCL 4 MG/2ML IJ SOLN: 4.0000 mg | Freq: Four times a day (QID) | INTRAMUSCULAR | Status: DC | PRN

## 2013-03-01 MED ORDER — OXYTOCIN 40 UNITS IN LACTATED RINGERS INFUSION - SIMPLE MED
62.5000 mL/h | INTRAVENOUS | Status: DC
  Filled 2013-03-01 (×2): qty 1000

## 2013-03-01 MED ORDER — PRENATAL MULTIVITAMIN CH
1.0000 | ORAL_TABLET | Freq: Every day | ORAL | Status: DC
  Administered 2013-03-02: 1 via ORAL
  Filled 2013-03-01: qty 1

## 2013-03-01 MED ORDER — PHENYLEPHRINE 40 MCG/ML (10ML) SYRINGE FOR IV PUSH (FOR BLOOD PRESSURE SUPPORT)
80.0000 ug | PREFILLED_SYRINGE | INTRAVENOUS | Status: DC | PRN
  Filled 2013-03-01: qty 2

## 2013-03-01 MED ORDER — FENTANYL 2.5 MCG/ML BUPIVACAINE 1/10 % EPIDURAL INFUSION (WH - ANES): 14.0000 mL/h | INTRAMUSCULAR | Status: DC | PRN

## 2013-03-01 MED ORDER — METHYLERGONOVINE MALEATE 0.2 MG/ML IJ SOLN
INTRAMUSCULAR | Status: AC
  Administered 2013-03-01: 0.2 mg
  Filled 2013-03-01: qty 1

## 2013-03-01 MED ORDER — DIBUCAINE 1 % RE OINT
1.0000 | TOPICAL_OINTMENT | RECTAL | Status: DC | PRN
Start: 2013-03-01 — End: 2013-03-02

## 2013-03-01 MED ORDER — OXYCODONE-ACETAMINOPHEN 5-325 MG PO TABS
1.0000 | ORAL_TABLET | ORAL | Status: DC | PRN
  Administered 2013-03-01: 1 via ORAL
  Filled 2013-03-01: qty 1

## 2013-03-01 MED ORDER — SIMETHICONE 80 MG PO CHEW
80.0000 mg | CHEWABLE_TABLET | ORAL | Status: DC | PRN
Start: 2013-03-01 — End: 2013-03-02

## 2013-03-01 MED ORDER — TETANUS-DIPHTH-ACELL PERTUSSIS 5-2.5-18.5 LF-MCG/0.5 IM SUSP: 0.5000 mL | Freq: Once | INTRAMUSCULAR | Status: DC

## 2013-03-01 MED ORDER — MISOPROSTOL 200 MCG PO TABS
ORAL_TABLET | ORAL | Status: AC
  Administered 2013-03-01: 1000 ug
  Filled 2013-03-01: qty 5

## 2013-03-01 MED ORDER — OXYCODONE-ACETAMINOPHEN 5-325 MG PO TABS: 1.0000 | ORAL_TABLET | ORAL | Status: DC | PRN

## 2013-03-01 MED ORDER — OXYTOCIN BOLUS FROM INFUSION
500.0000 mL | INTRAVENOUS | Status: DC
  Administered 2013-03-01 (×2): 500 mL via INTRAVENOUS

## 2013-03-01 MED ORDER — DIPHENHYDRAMINE HCL 25 MG PO CAPS: 25.0000 mg | ORAL_CAPSULE | Freq: Four times a day (QID) | ORAL | Status: DC | PRN

## 2013-03-01 MED ORDER — IBUPROFEN 600 MG PO TABS
600.0000 mg | ORAL_TABLET | Freq: Four times a day (QID) | ORAL | Status: DC | PRN
  Administered 2013-03-01: 600 mg via ORAL
  Filled 2013-03-01: qty 1

## 2013-03-01 MED ORDER — ONDANSETRON HCL 4 MG PO TABS
4.0000 mg | ORAL_TABLET | ORAL | Status: DC | PRN
Start: 2013-03-01 — End: 2013-03-02

## 2013-03-01 MED ORDER — BENZOCAINE-MENTHOL 20-0.5 % EX AERO: 1.0000 "application " | INHALATION_SPRAY | CUTANEOUS | Status: DC | PRN

## 2013-03-01 MED ORDER — SENNOSIDES-DOCUSATE SODIUM 8.6-50 MG PO TABS
2.0000 | ORAL_TABLET | ORAL | Status: DC
  Administered 2013-03-01: 2 via ORAL
  Filled 2013-03-01: qty 2

## 2013-03-01 MED ORDER — FENTANYL CITRATE 0.05 MG/ML IJ SOLN: 50.0000 ug | INTRAMUSCULAR | Status: DC | PRN

## 2013-03-01 MED ORDER — LACTATED RINGERS IV SOLN
500.0000 mL | INTRAVENOUS | Status: DC | PRN
  Administered 2013-03-01: 1000 mL via INTRAVENOUS

## 2013-03-01 NOTE — Progress Notes (Signed)
RN moved pt to mother baby via stretcher with additional RN assisting when pt became unresponsive, additional RNs called for assistance, rapid response called. Pt's bleeding had been small and now was noted heavy with multiple clots. Dr. Macon LargeAnyanwu, Janalyn ShyBrenda Erdy, Maggie CRNA, and Dr. Malen GauzeFoster at bedside. Postpartum hemorrhage protocol initiated.

## 2013-03-01 NOTE — Plan of Care (Signed)
Problem: Phase I Progression Outcomes Goal: IS, TCDB as ordered Outcome: Completed/Met Date Met:  03/01/13 No order nurse initiated due to NPO status, bedrest; and low grade temp @ 99.6

## 2013-03-01 NOTE — H&P (Signed)
Attestation of Attending Supervision of Advanced Practitioner (PA/CNM/NP): Evaluation and management procedures were performed by the Advanced Practitioner under my supervision and collaboration.  I have reviewed the Advanced Practitioner's note and chart, and I agree with the management and plan.  Trinitey Roache, MD, FACOG Attending Obstetrician & Gynecologist Faculty Practice, [facility]  

## 2013-03-01 NOTE — Progress Notes (Signed)
Additional RNs and Rapid Response team at bedside

## 2013-03-01 NOTE — Progress Notes (Signed)
Faculty Practice OB/GYN Attending Note  Subjective:  Called to evaluate patient with postpartum hemorrhage (PPH).   On arrival, patient was lying in a pool of blood, and several nurses were in the room helping with the patient.  Patient's daughter was helping to translate as patient had a language barrier.     Objective:  Blood pressure 135/70, pulse 80, temperature 98.2 F (36.8 C), temperature source Oral, resp. rate 18, height 4\' 10"  (1.473 m), weight 143 lb (64.864 kg), last menstrual period 06/24/2012, unknown if currently breastfeeding.  Gen: NAD Abdomen: NT, firm fundus Pelvic:  Multiple clots evacuated from lower uterine segment. EBL ~107300ml. Fundus and LUS firm after exam.  Assessment & Plan:  38 y.o. [id number] PPD#0 s/p SVD now complicated by PPH.  Methergine 0.2 mg IM given, Cytotec 1000 mcg PR x 1 placed, IV Pitocin in LR bolus infusing.  CBC and DIC panel ordered.  Second IV placed.   Type and screen sample sent.  Will continue to monitor closely, may need further intervention if bleeding continues.  Routine postpartum care, may transfer to floor if stable.   [name]  Emonie Espericueta, MD, FACOG Attending Obstetrician & Gynecologist Faculty Practice, [facility] [place]

## 2013-03-01 NOTE — Progress Notes (Signed)
Called Dr. Ike Benedom regarding patient's status. Patient tolerated well ambulating to bathroom for second time. VSS, no complaints of dizziness, fundus firm, lochia scant. Asked about foley being discontinued. May discontinue foley. Will maintain NSLs until AM. Will continue to monitor patient.

## 2013-03-01 NOTE — Progress Notes (Signed)
UR chart review completed.  
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
## 2013-03-01 NOTE — Progress Notes (Signed)
Postpartum hemorrhoage protocol in place. Blood Bank notified of type and cross needed for hold at this time. Awaiting CBC and DIC panel to be drawn. 2nd IV obtained. Anesthesia and Dr Lovett SoxAnywanwu at bedside.

## 2013-03-02 MED ORDER — IBUPROFEN 600 MG PO TABS: 600.0000 mg | ORAL_TABLET | Freq: Four times a day (QID) | ORAL | Status: DC

## 2013-03-02 MED ORDER — GUAIFENESIN 100 MG/5ML PO SOLN
5.0000 mL | ORAL | Status: DC | PRN
  Administered 2013-03-02 (×2): 100 mg via ORAL
  Filled 2013-03-02 (×2): qty 15

## 2013-03-02 MED ORDER — FERROUS SULFATE 325 (65 FE) MG PO TABS: 325.0000 mg | ORAL_TABLET | Freq: Two times a day (BID) | ORAL | Status: DC

## 2013-03-02 MED ORDER — PNEUMOCOCCAL VAC POLYVALENT 25 MCG/0.5ML IJ INJ
0.5000 mL | INJECTION | INTRAMUSCULAR | Status: AC
  Administered 2013-03-02: 0.5 mL via INTRAMUSCULAR
  Filled 2013-03-02 (×2): qty 0.5

## 2013-03-02 MED ORDER — PNEUMOCOCCAL VAC POLYVALENT 25 MCG/0.5ML IJ INJ: 0.5000 mL | INJECTION | INTRAMUSCULAR | Status: DC

## 2013-03-02 NOTE — Discharge Summary (Signed)
Obstetric Discharge Summary Reason for Admission: onset of labor and rupture of membranes Prenatal Procedures: none Intrapartum Procedures: spontaneous vaginal delivery Postpartum Procedures: none Complications-Operative and Postpartum: hemorrhage within hours of delivery while still on Birthing Suites; tx with Cytotec, Methergine and additional IV Pitocin Hemoglobin  Date Value Range Status  03/01/2013 7.0* 12.0 - 15.0 g/dL Final     HCT  Date Value Range Status  03/01/2013 20.6* 36.0 - 46.0 % Final   Sierra Blanchard is a 38yo [id number] admitted at 38.0wks in active labor/SROM on 03/01/13. She progressed to SVD within a few hours of arrival, and then proceeded to have a PPH a couple of hours post delivery. She was treated according to protocol and responded well and was tx to the Mother Baby Unit. Her Hgb beforehand was 10.8 and then approx 6 hours post-hemorrhage it was 7.0.  By PPD#1 she denies dizziness and is requesting to be discharged home. She is bottlefeeding and is unsure of contraception at this time.  Physical Exam:  General: alert, cooperative and no distress Heart: RRR Lungs: nl effort Lochia: appropriate Uterine Fundus: firm DVT Evaluation: No evidence of DVT seen on physical exam.  Discharge Diagnoses: Term Pregnancy-delivered  Discharge Information: Date: 03/02/2013 Activity: pelvic rest Diet: routine Medications: PNV, Ibuprofen and Iron Condition: stable Instructions: refer to practice specific booklet Discharge to: home Follow-up Information   [facility] Schedule an appointment as soon as possible for a visit in 4 weeks.   Contact information:   [address] [place] [id number] [phone]      Newborn Data: Live born female  Birth Weight: 7 lb 6.2 oz (3351 g) APGAR: 9, 9  Home with mother.  SHAW, KIMBERLY 03/02/2013, 7:30 AM

## 2013-03-02 NOTE — Progress Notes (Signed)
Spoke with Philipp DeputyKim Shaw CNM requesting cough syrup as patient had requested this when RN was using language line earlier. Order received. Will continue to monitor.

## 2013-03-02 NOTE — Progress Notes (Signed)
Clinical Social Work Department PSYCHOSOCIAL ASSESSMENT - MATERNAL/CHILD 03/02/2013  Patient:  CATTLEYA, DOBRATZ  Account Number:  [id number]  Admit Date:  03/01/2013  Ardine Eng Name:   Merri Ray    Clinical Social Worker:  Marki Frede, LCSW   Date/Time:  03/02/2013 02:25 PM  Date Referred:  03/02/2013   Referral source  Central Nursery     Referred reason  [facility]   Other referral source:    I:  FAMILY / HOME ENVIRONMENT Child's legal guardian:  PARENT  Guardian - Name Guardian - Age Guardian - Address  [place]  [place], [place] [address]  [place], [place]  same as above   Other household support members/support persons Other support:    II  PSYCHOSOCIAL DATA Information Source:    Occupational hygienist Employment:   Father is employed   Museum/gallery curator resources:  [employer] [place] / Grade:   Maternity Care Coordinator / Child Services Coordination / Early Interventions:  Cultural issues impacting care:    III  STRENGTHS  Strength comment:   Supportive friends that will assist the couple with transportation to go to the store and purchase the crib or playpen and carseat.  IV  RISK FACTORS AND CURRENT PROBLEMS Current Problem:    Parents don't have car seat or separate sleeping arrangements for newborn.   V  SOCIAL WORK ASSESSMENT Met with both parents.  They are married and have 3 other dependents ages 7,17, and 31.  Parents were pleasant and receptive to social work intervention.    Parents are from [place] and speaks limited [place].  Utilized interpreter services [phone]).  Subodh ID# [id number] translated.  Informed that parents arrived from [place] about 2 years ago.  Mother states that she had limited prenatal care because she did not find out about the pregnancy until she was 7 months pregnant.  She denies any hx of substance abuse or mental illness.  UDS on newborn was negative.   Parents informed of the  hospital's drug screening policy.          VI SOCIAL WORK PLAN Social Work Plan  No Further Intervention Required / No Barriers to Discharge   Type of pt/family education:   If child protective services report - county:   If child protective services report - date:   Information/referral to community resources comment:  Discussed the [facility] program and provided the family with contact information. Parents did not have a carseat or separate sleeping arrangements for newborn.  Discussed importance of neborn having separate sleeping arrangement and a car seat for discharge.  Father agreed to have someone take him to the store to purchase the items needed prior to newborn's discharge.    Other social work plan:   CSW will monitor drug screen result.    Salah Burlison J, LCSW

## 2013-03-02 NOTE — Discharge Instructions (Signed)

## 2013-03-04 LAB — TYPE AND SCREEN
ABO/RH(D): B POS
Antibody Screen: NEGATIVE
Unit division: 0
Unit division: 0

## 2013-03-04 NOTE — Discharge Summary (Signed)
Attestation of Attending Supervision of Advanced Practitioner (CNM/NP): Evaluation and management procedures were performed by the Advanced Practitioner under my supervision and collaboration.  I have reviewed the Advanced Practitioner's note and chart, and I agree with the management and plan.  Antonietta Lansdowne 03/04/2013 7:37 AM   

## 2013-03-05 ENCOUNTER — Other Ambulatory Visit: Payer: Medicaid Other

## 2013-03-05 ENCOUNTER — Encounter: Payer: Medicaid Other | Admitting: Advanced Practice Midwife

## 2013-03-05 ENCOUNTER — Ambulatory Visit (HOSPITAL_COMMUNITY): Payer: Medicaid Other

## 2013-03-13 ENCOUNTER — Encounter (HOSPITAL_COMMUNITY): Payer: Self-pay | Admitting: Emergency Medicine

## 2013-03-13 ENCOUNTER — Emergency Department (HOSPITAL_COMMUNITY)
Admission: EM | Admit: 2013-03-13 | Discharge: 2013-03-13 | Disposition: A | Discharge status: Home / Self Care | Payer: Medicaid Other | Attending: Emergency Medicine | Admitting: Emergency Medicine

## 2013-03-13 DIAGNOSIS — M549 Dorsalgia, unspecified: Secondary | ICD-10-CM | POA: Insufficient documentation

## 2013-03-13 DIAGNOSIS — I82409 Acute embolism and thrombosis of unspecified deep veins of unspecified lower extremity: Secondary | ICD-10-CM | POA: Insufficient documentation

## 2013-03-13 DIAGNOSIS — M7989 Other specified soft tissue disorders: Secondary | ICD-10-CM

## 2013-03-13 MED ORDER — RIVAROXABAN 15 MG PO TABS
15.0000 mg | ORAL_TABLET | Freq: Once | ORAL | Status: AC
  Administered 2013-03-13: 15 mg via ORAL
  Filled 2013-03-13: qty 1

## 2013-03-13 MED ORDER — XARELTO VTE STARTER PACK 15 & 20 MG PO TBPK: 15.0000 mg | ORAL_TABLET | ORAL | Status: DC

## 2013-03-13 NOTE — Discharge Instructions (Signed)
Follow up with your primary care provider(Dr. Fleet ContrasEdwin Avbuere) within 1 week. Recommend light duty as tolerated. Return to emergency department if your pain becomes worse, you having color changes to your affected leg, you develop chest pain or shortness of breath.   Resource guide provided below.    Emergency Department Resource Guide 1) Find a Doctor and Pay Out of Pocket Although you won't have to find out who is covered by your insurance plan, it is a good idea to ask around and get recommendations. You will then need to call the office and see if the doctor you have chosen will accept you as a new patient and what types of options they offer for patients who are self-pay. Some doctors offer discounts or will set up payment plans for their patients who do not have insurance, but you will need to ask so you aren't surprised when you get to your appointment.  2) Contact Your Local Health Department Not all health departments have doctors that can see patients for sick visits, but many do, so it is worth a call to see if yours does. If you don't know where your local health department is, you can check in your phone book. The CDC also has a tool to help you locate your state's health department, and many state websites also have listings of all of their local health departments.  3) Find a Walk-in Clinic If your illness is not likely to be very severe or complicated, you may want to try a walk in clinic. These are popping up all over the country in pharmacies, drugstores, and shopping centers. They're usually staffed by nurse practitioners or physician assistants that have been trained to treat common illnesses and complaints. They're usually fairly quick and inexpensive. However, if you have serious medical issues or chronic medical problems, these are probably not your best option.  No Primary Care Doctor: - Call Health Connect at  [phone] - they can help you locate a primary care doctor that   accepts your insurance, provides certain services, etc. - Physician Referral Service- [phone]  Chronic Pain Problems: Organization         Address  Phone   Notes  [facility]  [phone] Patients need to be referred by their primary care doctor.   Medication Assistance: Organization         Address  Phone   Notes  [facility] [address] [place], [place] [address] [phone]) [phone] --Must be a resident of [place] -- Must have NO insurance coverage whatsoever (no Medicaid/ Medicare, etc.) -- The pt. MUST have a primary care doctor that directs their care regularly and follows them in the community   [facility]  [phone]   [employer]  [phone]    Agencies that provide inexpensive medical care: Organization         Address  Phone   Notes  [facility]  [phone]   [facility]    [phone]) [phone]   [facility] [address] [place], [place] [address] [phone]) [phone]   [facility] [address], [place] [phone]   [facility]    [phone]) [phone]   [facility]    [phone]   [facility]  [address], [place] Phone:  [phone], Fax:  [phone] Hours of Operation:  9 am -  6 pm, M-F.  Also accepts Medicaid/Medicare and self-pay.  °[facility] ° [address], [place] Phone: [phone], Fax: [phone]. Hours of Operation:  8:30 am - 5:30 pm, M-F.  Also accepts Medicaid and self-pay.  °[facility] [place] [address], [place] Phone: ([phone]) [phone]   °[facility] [address], [place], [place] (336)[phone], Ext. 123 Mondays & Thursdays: 7-9 AM.  First 15 patients are seen on a first come, first serve basis. °  ° °Medicaid-accepting [place] Providers: ° °Organization          Address  Phone   Notes  °[facility] [address], [place] ([phone]) [phone] Also accepts self-pay patients.  °[facility] [address], [place] ° (336) [phone]   °[facility] [address], [place] (336) [phone]   °[facility] [address] [place] [address], [place] (336) [phone]   °Veita Bland [address], [place]  ° ([phone]) [phone] Only accepts [place] Access Medicaid patients after they have their name applied to their card.  ° °Self-Pay (no insurance) in [place]: ° °Organization         Address  Phone   Notes  °Sickle Cell Patients, [facility] [address], [place] (336) [phone]   °[facility] [address], [place] (336) [phone]   °[facility] ° [address], [place] (336) [phone]   °[facility]/Dr. Osei-Bonsu ° [address] [place] [address], [place] or [address], [place] ([phone]) [phone] Phone number for both [place] and [place] locations is the same.  °[facility] [address], [place] (336) [phone]   °[facility] [address] [place] [address], [place] or [address] (336) 852-7530 °(336) [phone]   °[facility] [address], [place] (336) 350-1642, phone; ([phone]) [phone], fax Sees patients 1st and 3rd Saturday of every month.  Must not qualify for public or private insurance (i.e. Medicaid, Medicare, [place] Health Choice, Veterans' Benefits) • Household income should be no more than 200% of the poverty level •The clinic cannot treat you if you are pregnant or think you are pregnant • Sexually transmitted diseases are not treated at the clinic.  ° ° °Dental Care: °Organization         Address  Phone  Notes  °[facility] [address],  [place] (336) [phone] Accepts children up to age 21 who are enrolled in Medicaid or [place] Health Choice; pregnant women with a Medicaid card; and children who have applied for Medicaid or [place] Health Choice, but were declined, whose parents can pay a reduced fee at time of service.  °[facility] [place]  [address], [place] (336) 641-7733 Accepts children up to age 21 who are enrolled in Medicaid or [place] Health Choice; pregnant women with a Medicaid card; and children who have applied for Medicaid or [place] Health Choice, but were declined, whose parents can pay a reduced fee at time of service.  °[facility] ° [address],  (336) 641-4533 Patients are seen by appointment only. Walk-ins are not accepted. [facility] will see patients 18 years of age and   older. °Monday - Tuesday (8am-5pm) °Most Wednesdays (8:30-5pm) °$30 per visit, cash only  °[facility] ° [address], [place] (336) [phone] Patients are seen by appointment only. Walk-ins are not accepted. [facility] will see patients 18 years of age and older. °One Wednesday Evening (Monthly: Volunteer Based).  $30 per visit, cash only  °[facility]  ([phone]) [phone] for adults; Children under age 4, call Graduate Pediatric Dentistry at ([phone]. Children aged 4-14, please call [phone]) [phone] to request a pediatric application. ° Dental services are provided in all areas of dental care including fillings, crowns and bridges, complete and partial dentures, implants, gum treatment, root canals, and extractions. Preventive care is also provided. Treatment is provided to both adults and children. °Patients are selected via a lottery and there is often a waiting list. °  °[facility] [address], °[place] ° (336) 763-8833 [email or web address] °  °[facility] [address], [place], [place]  (336)723-1848, Ext. 123 Second and Fourth Thursday of each month, opens at 6:30 AM; Clinic ends at 9 AM.  Patients are seen on a first-come first-served basis, and a limited number are seen during each clinic.  ° °[facility] ° [address], [place], [place] (336) [phone]   Eligibility Requirements °You must have lived in [place], [place], or [place] counties for at least the last three months. °  You cannot be eligible for state or federal sponsored healthcare insurance, including Veterans Administration, Medicaid, or Medicare. °  You generally cannot be eligible for healthcare insurance through your employer.  °  How to apply: °Eligibility screenings are held every Tuesday and Wednesday afternoon from 1:00 pm until 4:00 pm. You do not need an appointment for the interview!  °[facility] [address], [place], [place] 336-631-2330   °[facility]  336-342-8273   °[facility]  336-703-3100   °[facility]  336-570-6415   ° °Behavioral Health Resources in the Community: °Intensive Outpatient Programs °Organization         Address  Phone  Notes  °[facility] [address], [place], [place] 336-878-6098   °[facility] [address], [place], [place] 336-832-9800   °[facility] [address], [place], [place] ° 336-882-2125   °[facility] [address],  °[place], [place] 1-800-853-5163 or 336-641-4981   °Substance Abuse Resources °Organization         Address  Phone  Notes  °[facility]  336-882-2125   °[facility]  336-784-9470   °[facility]  336-285-9073   °[facility]  336-845-3988   °Residential & Outpatient Substance Abuse Program  1-800-659-3381   °Psychological Services °Organization         Address  Phone  Notes  °[facility]  336- 832-9600   °[facility]  336- 378-7881    °[facility] [address], [place] 1-800-853-5163 or 336-641-4981   ° °Mobile Crisis Teams °Organization         Address  Phone  Notes  °[facility], Mobile Crisis Care Unit  1-877-626-1772   °Assertive °[facility] ° [address] [place], [place] 336-834-9664   °Sharon DeEsch [address] °[place] 336-554-5454   ° °Self-Help/Support Groups °Organization         Address    Phone             Notes  °[facility] [place] - variety of support groups  336- 373-1402 Call for more information  °Narcotics Anonymous (NA), [facility] [address], °[place]  2 meetings at this location  ° °Residential Treatment Programs °Organization         Address  Phone  Notes  °[facility] [address],    °[place]  1-866-801-8205   °[facility] ° [address], [place], [place] 704-293-8524   °[facility] [address], [place] 336-845-3988 Admissions: 8am-3pm M-F  °[facility] [address]    °[place], [place] 336-841-1104   °[facility] [address], [place], [place] 336-379-7146   °[facility] [address]  °[place], [place] 336-285-9073   °[facility] - Intensive Outpatient [address], [place], [place] 336-852-3033   °[facility] [address]  °[place], [place] 1-877-615-2722 or 336-784-9470   °[facility] ([facility] [address] [place], [place] 336-227-7417 Accepts Medicaid  °[facility] [address]  °[place] 1-800-659-3381 Substance Abuse/Addiction Treatment  ° °[place] Behavioral Health Resources °Organization         Address  Phone  Notes  °[facility]  (888) 581-9988   °Julie Brannon, PhD [address] [place], [place]   (336) 349-5553 or (336) 951-0000   °[facility]   [address] °[place], [place] (336) 349-4454   °[facility] [address], [place], [place] (336) 342-8316 Insurance/Medicaid/sponsorship through [facility]  °[facility] [address]                                    [place], [place] (336) 342-8316 Therapy/tele-psych/case  °[facility] [address]  ° [place], [place] (336) 349-2233    °Dr. Arfeen  (336) 349-4544   °[facility] [place]  [employer] [place] [facility] 1) [address],  °2) [address], [place] °3)  [address], [place] (336) 349-3220 °(336) 342-7768 ° °(336) 342-8140   °[place] [facility] (336) 342-1394 or (336) 342-3537 (After Hours)    ° ° ° °

## 2013-03-13 NOTE — ED Notes (Signed)
Per pt and family right calf pain x 1 week with swelling. Denies injury

## 2013-03-13 NOTE — Progress Notes (Signed)
*  PRELIMINARY RESULTS* Vascular Ultrasound Right lower extremity venous duplex has been completed.  Preliminary findings: DVT involving the right popliteal vein, posterior tibial veins, and peroneal veins.    Farrel DemarkJill [place], RDMS, RVT  03/13/2013, 2:39 PM

## 2013-03-13 NOTE — ED Notes (Signed)
C/o right calf area pain & edema x 1 week. Denies injury, SOB, long trips. Reports increased pain with ambulating. Has not tried any OTC meds for pain

## 2013-03-13 NOTE — Progress Notes (Signed)
03/13/13 8:00p  Beecher McardleW. Zailey Audia RN BSN [phone] ED CM reviewed record, [facility]  Pt withheld this information. Ursula BeathJacob Gray Lacey and [facility] RN  made aware. In room to speak with patient pharmacist at bedside communicating via interpreter. Pt questioned about recent delivery and asked about breast feeding via interpreter. Pt reports she is breastfeeding, Pharmacist stressed via interpreter that patient should discontinue breastfeeding while on this medication. Pt verbalized understanding via interpreter. It was reiterated again that patient should discontinue breastfeeding while on the [facility]. Pt verbalized via interpreter that she will discontinue breastfeeding,   03/13/13 6:47pm Beecher McardleW. Nicholas Ossa RN BSN [phone] ED CM consulted to meet with patient regarding medication issue and PCP issues. Pt presented to ED with rt leg pain. Pt was diagnosed with DVT and will be started on Xerelto. Pt has [place] insurance, which covers  [employer] . Ordered placed for start pack ED pharmacist will go over [facility] with patient prior to discharge. Discussed with [facility] RN on Pod E. Pt is non [place] speaking.  Interpreter called ID #[id number], Asked patient about PCP, pt states via interpreter she does not have a PCP.   Dr. Fleet ContrasEdwin Avbuere [phone] is her [place] Medicaid assigned PCP. Pt states, she has never seen him. Explained that we will call to make appointment with Dr. Concepcion ElkAvbuere for follow-up via interpreter. Pt agrees with plan. Explained that someone will contact her from the office with an appointment at the number she provided [phone] ([phone]). Discussed the importance and benefits of a PCP and not using ED for primary care via interpreter. Pt verbalized understanding via interpreter.  ED CM will follow up with scheduling appointment i in the morning.

## 2013-03-13 NOTE — ED Notes (Signed)
Pharmacist at bedside discussing DVT regime with patient using interpreter phone.
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
## 2013-03-14 NOTE — Progress Notes (Signed)
Contacted Dr. Concepcion ElkAvbuere office [phone],  appointment made for 1/27 at 2:00 at the [address] Office. Contacted patient via interpreter at [phone]. Also, Offered [facility] patient started on  Xeralto 1/14 for rt. Leg DVT. Pt declines as per interpreter. No further ED CM needs identified.

## 2013-03-14 NOTE — ED Provider Notes (Signed)
Medical screening examination/treatment/procedure(s) were performed by non-physician practitioner and as supervising physician I was immediately available for consultation/collaboration.  EKG Interpretation   None        Toy BakerAnthony T Fard Borunda, MD 03/14/13 1556

## 2013-03-16 ENCOUNTER — Inpatient Hospital Stay (HOSPITAL_COMMUNITY)
Admission: AD | Admit: 2013-03-16 | Discharge: 2013-03-16 | Disposition: A | Discharge status: Home / Self Care | Payer: Medicaid Other | Source: Ambulatory Visit | Attending: Obstetrics & Gynecology | Admitting: Obstetrics & Gynecology

## 2013-03-16 ENCOUNTER — Encounter (HOSPITAL_COMMUNITY): Payer: Self-pay | Admitting: *Deleted

## 2013-03-16 DIAGNOSIS — I82409 Acute embolism and thrombosis of unspecified deep veins of unspecified lower extremity: Secondary | ICD-10-CM

## 2013-03-16 DIAGNOSIS — I82401 Acute embolism and thrombosis of unspecified deep veins of right lower extremity: Secondary | ICD-10-CM

## 2013-03-16 DIAGNOSIS — O871 Deep phlebothrombosis in the puerperium: Secondary | ICD-10-CM | POA: Insufficient documentation

## 2013-03-16 DIAGNOSIS — I824Z9 Acute embolism and thrombosis of unspecified deep veins of unspecified distal lower extremity: Secondary | ICD-10-CM | POA: Insufficient documentation
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
## 2013-03-16 NOTE — MAU Note (Signed)
Patient presents with right leg swelling. Delivered on January 2nd and was recently diagnosed with a DVT in her leg but states the swelling has worsened over the last week.

## 2013-03-16 NOTE — Discharge Instructions (Signed)
Deep Vein Thrombosis  A deep vein thrombosis (DVT) is a blood clot that develops in the deep, larger veins of the leg, arm, or pelvis. These are more dangerous than clots that might form in veins near the surface of the body. A DVT can lead to complications if the clot breaks off and travels in the bloodstream to the lungs.   A DVT can damage the valves in your leg veins, so that instead of flowing upward, the blood pools in the lower leg. This is called post-thrombotic syndrome, and it can result in pain, swelling, discoloration, and sores on the leg.  CAUSES  Usually, several things contribute to blood clots forming. Contributing factors include:   The flow of blood slows down.   The inside of the vein is damaged in some way.   You have a condition that makes blood clot more easily.  RISK FACTORS  Some people are more likely than others to develop blood clots. Risk factors include:    Older age, especially over 75 years of age.   Having a family history of blood clots or if you have already had a blot clot.   Having major or lengthy surgery. This is especially true for surgery on the hip, knee, or belly (abdomen). Hip surgery is particularly high risk.   Breaking a hip or leg.   Sitting or lying still for a long time. This includes long-distance travel, paralysis, or recovery from an illness or surgery.   Having cancer or cancer treatment.   Having a long, thin tube (catheter) placed inside a vein during a medical procedure.   Being overweight (obese).   Pregnancy and childbirth.   Hormone changes make the blood clot more easily during pregnancy.   The fetus puts pressure on the veins of the pelvis.   There is a risk of injury to veins during delivery or a caesarean. The risk is highest just after childbirth.   Medicines with the female hormone estrogen. This includes birth control pills and hormone replacement therapy.   Smoking.   Other circulation or heart problems.    SIGNS AND SYMPTOMS  When  a clot forms, it can either partially or totally block the blood flow in that vein. Symptoms of a DVT can include:   Swelling of the leg or arm, especially if one side is much worse.   Warmth and redness of the leg or arm, especially if one side is much worse.   Pain in an arm or leg. If the clot is in the leg, symptoms may be more noticeable or worse when standing or walking.  The symptoms of a DVT that has traveled to the lungs (pulmonary embolism, PE) usually start suddenly and include:   Shortness of breath.   Coughing.   Coughing up blood or blood-tinged phlegm.   Chest pain. The chest pain is often worse with deep breaths.   Rapid heartbeat.  Anyone with these symptoms should get emergency medical treatment right away. Call your local emergency services (911 in the [place] if you have these symptoms.  DIAGNOSIS  If a DVT is suspected, your health care provider will take a full medical history and perform a physical exam. Tests that also may be required include:   Blood tests, including studies of the clotting properties of the blood.   Ultrasonography to see if you have clots in your legs or lungs.   X-rays to show the flow of blood when dye is injected into the veins (  venography).   Studies of your lungs if you have any chest symptoms.  PREVENTION   Exercise the legs regularly. Take a brisk 30-minute walk every day.   Maintain a weight that is appropriate for your height.   Avoid sitting or lying in bed for long periods of time without moving your legs.   Women, particularly those over the age of 35 years, should consider the risks and benefits of taking estrogen medicines, including birth control pills.   Do not smoke, especially if you take estrogen medicines.   Long-distance travel can increase your risk of DVT. You should exercise your legs by walking or pumping the muscles every hour.   In-hospital prevention:   Many of the risk factors above relate to situations that exist with  hospitalization, either for illness, injury, or elective surgery.   Your health care provider will assess you for the need for venous thromboembolism prophylaxis when you are admitted to the hospital. If you are having surgery, your surgeon will assess you the day of or day after surgery.   Prevention may include medical and nonmedical measures.  TREATMENT  Once identified, a DVT can be treated. It can also be prevented in some circumstances. Once you have had a DVT, you may be at increased risk for a DVT in the future. The most common treatment for DVT is blood thinning (anticoagulant) medicine, which reduces the blood's tendency to clot. Anticoagulants can stop new blood clots from forming and stop old ones from growing. They cannot dissolve existing clots. Your body does this by itself over time. Anticoagulants can be given by mouth, by IV access, or by injection. Your health care provider will determine the best program for you. Other medicines or treatments that may be used are:   Heparin or related medicines (low molecular weight heparin) are usually the first treatment for a blood clot. They act quickly. However, they cannot be taken orally.   Heparin can cause a fall in a component of blood that stops bleeding and forms blood clots (platelets). You will be monitored with blood tests to be sure this does not occur.   Warfarin is an anticoagulant that can be swallowed. It takes a few days to start working, so usually heparin or related medicines are used in combination. Once warfarin is working, heparin is usually stopped.   Less commonly, clot dissolving drugs (thrombolytics) are used to dissolve a DVT. They carry a high risk of bleeding, so they are used mainly in severe cases, where your life or a limb is threatened.   Very rarely, a blood clot in the leg needs to be removed surgically.   If you are unable to take anticoagulants, your health care provider may arrange for you to have a filter placed  in a main vein in your abdomen. This filter prevents clots from traveling to your lungs.  HOME CARE INSTRUCTIONS   Take all medicines prescribed by your health care provider. Only take over-the-counter or prescription medicines for pain, fever, or discomfort as directed by your health care provider.   Warfarin. Most people will continue taking warfarin after hospital discharge. Your health care provider will advise you on the length of treatment (usually 3 6 months, sometimes lifelong).   Too much and too little warfarin are both dangerous. Too much warfarin increases the risk of bleeding. Too little warfarin continues to allow the risk for blood clots. While taking warfarin, you will need to have regular blood tests to measure your   blood clotting time. These blood tests usually include both the prothrombin time (PT) and international normalized ratio (INR) tests. The PT and INR results allow your health care provider to adjust your dose of warfarin. The dose can change for many reasons. It is critically important that you take warfarin exactly as prescribed, and that you have your PT and INR levels drawn exactly as directed.   Many foods, especially foods high in vitamin K, can interfere with warfarin and affect the PT and INR results. Foods high in vitamin K include spinach, kale, broccoli, cabbage, collard and turnip greens, brussel sprouts, peas, cauliflower, seaweed, and parsley as well as beef and pork liver, green tea, and soybean oil. You should eat a consistent amount of foods high in vitamin K. Avoid major changes in your diet, or notify your health care provider before changing your diet. Arrange a visit with a dietitian to answer your questions.   Many medicines can interfere with warfarin and affect the PT and INR results. You must tell your health care provider about any and all medicines you take. This includes all vitamins and supplements. Be especially cautious with aspirin and  anti-inflammatory medicines. Ask your health care provider before taking these. Do not take or discontinue any prescribed or over-the-counter medicine except on the advice of your health care provider or pharmacist.   Warfarin can have side effects, primarily excessive bruising or bleeding. You will need to hold pressure over cuts for longer than usual. Your health care provider or pharmacist will discuss other potential side effects.   Alcohol can change the body's ability to handle warfarin. It is best to avoid alcoholic drinks or consume only very small amounts while taking warfarin. Notify your health care provider if you change your alcohol intake.   Notify your dentist or other health care providers before procedures.   Activity. Ask your health care provider how soon you can go back to normal activities. It is important to stay active to prevent blood clots. If you are on anticoagulant medicine, avoid contact sports.   Exercise. It is very important to exercise. This is especially important while traveling, sitting, or standing for long periods of time. Exercise your legs by walking or by pumping the muscles frequently. Take frequent walks.   Compression stockings. These are tight elastic stockings that apply pressure to the lower legs. This pressure can help keep the blood in the legs from clotting. You may need to wear compression stockings at home to help prevent a DVT.   Do not smoke. If you smoke, quit. Ask your health care provider for help with quitting smoking.   Learn as much as you can about DVT. Knowing more about the condition should help you keep it from coming back.   Wear a medical alert bracelet or carry a medical alert card.  SEEK MEDICAL CARE IF:   You notice a rapid heartbeat.   You feel weaker or more tired than usual.   You feel faint.   You notice increased bruising.   You feel your symptoms are not getting better in the time expected.   You believe you are having side  effects of medicine.  SEEK IMMEDIATE MEDICAL CARE IF:   You have chest pain.   You have trouble breathing.   You have new or increased swelling or pain in one leg.   You cough up blood.   You notice blood in vomit, in a bowel movement, or in urine.  MAKE SURE   YOU:   Understand these instructions.   Will watch your condition.   Will get help right away if you are not doing well or get worse.  Document Released: 02/14/2005 Document Revised: 12/05/2012 Document Reviewed: 10/22/2012  ExitCare Patient Information 2014 [employer].

## 2013-03-17 NOTE — MAU Provider Note (Signed)
Attestation of Attending Supervision of Advanced Practitioner (PA/CNM/NP): Evaluation and management procedures were performed by the Advanced Practitioner under my supervision and collaboration.  I have reviewed the Advanced Practitioner's note and chart, and I agree with the management and plan.  Jayleon Mcfarlane, MD, FACOG Attending Obstetrician & Gynecologist Faculty Practice, [facility]  

## 2013-03-26 ENCOUNTER — Telehealth: Payer: Self-pay | Admitting: Obstetrics & Gynecology

## 2013-03-26 DIAGNOSIS — Z789 Other specified health status: Secondary | ICD-10-CM

## 2013-03-26 DIAGNOSIS — I82409 Acute embolism and thrombosis of unspecified deep veins of unspecified lower extremity: Secondary | ICD-10-CM

## 2013-03-26 NOTE — Telephone Encounter (Signed)
Faculty Practice OB/GYN Attending Phone Call Documentation  I received a call from a [employer] RN who visited Sierra Blanchard, a 38 y.o. Nepali-speaking [id number] s/p SVD on 03/01/13 complicated with immediate PPH and RLE DVT diagnosed on 03/13/13.  Patient has been getting treated with Xarelto and was given a starter pack which includes enough tablets for one month of therapy  (15 mg po bid -> 20 mg po qd starting on day 22 to 30); she will need a new prescription of 20 mg po qd for five additional months (needs therapy for a total of six months).  The RN reports that the patient reports she "ran out" of the medication on 03/16/13, and has not taken any medication since then.  She also has not taken her prescribed oral iron therapy for anemia after the PPH, discharge hemoglobin was 7.0.  The RN also reports that the patient desires bilateral tubal sterilization and wants to come in to the clinic to discuss this, she does not have any scheduled postpartum appointment.  RN reports that patient denies any R calf pain; she was seen for this complaint in the MAU on 03/16/13 and the instructions for Xarelto administration were again reviewed in detail.  The [employer] RN was concerned that this patient was not complaint with her prescribed medications and feels she needs to be seen in clinic soon to address these multiple issues.  Using the assistance of a Nepali phone interpreter Sierra Blanchard([phone]); the patient was called using documented phone numbers for her home and mobile phones [phone] for home and [phone] for mobile).  There was no response after multiple tries; voice mail message was left for the patient on her home phone.  Patient needs to be called and informed of importance of taking the Xarelto as prescribed to prevent further DVT/PE which could be life-threatening; she needs to take this as prescribed for six months.  It is unlikely she has run out of this medication after three days as she  was given a 30 day starter pack.  She also needs an appointment in clinic ASAP with any attending to discuss this anticoagulation recommendation and also discuss/schedule for bilateral tubal sterilization (needs to sign Medicaid papers).  As for her anemia, taking oral iron should be recommended.  This note was routed to the [facility] RN pool for further action.  [name]  [name], MD, FACOG Attending Obstetrician & Gynecologist Faculty Practice, [facility]

## 2013-03-28 ENCOUNTER — Ambulatory Visit (INDEPENDENT_AMBULATORY_CARE_PROVIDER_SITE_OTHER): Payer: Medicaid Other | Admitting: Obstetrics & Gynecology

## 2013-03-28 ENCOUNTER — Encounter: Payer: Self-pay | Admitting: Obstetrics & Gynecology

## 2013-03-28 VITALS — BP 123/73 | HR 82 | Ht <= 58 in | Wt 131.5 lb

## 2013-03-28 DIAGNOSIS — Z609 Problem related to social environment, unspecified: Secondary | ICD-10-CM

## 2013-03-28 DIAGNOSIS — D649 Anemia, unspecified: Secondary | ICD-10-CM

## 2013-03-28 DIAGNOSIS — O9081 Anemia of the puerperium: Secondary | ICD-10-CM | POA: Insufficient documentation

## 2013-03-28 DIAGNOSIS — Z302 Encounter for sterilization: Secondary | ICD-10-CM

## 2013-03-28 DIAGNOSIS — I82409 Acute embolism and thrombosis of unspecified deep veins of unspecified lower extremity: Secondary | ICD-10-CM

## 2013-03-28 DIAGNOSIS — Z789 Other specified health status: Secondary | ICD-10-CM

## 2013-03-28 MED ORDER — FERROUS SULFATE 325 (65 FE) MG PO TABS: 325.0000 mg | ORAL_TABLET | Freq: Two times a day (BID) | ORAL | Status: DC

## 2013-03-28 MED ORDER — RIVAROXABAN 15 MG PO TABS: 15.0000 mg | ORAL_TABLET | Freq: Two times a day (BID) | ORAL | Status: DC

## 2013-03-28 MED ORDER — RIVAROXABAN 20 MG PO TABS
20.0000 mg | ORAL_TABLET | Freq: Every day | ORAL | Status: DC
Start: 2013-03-28 — End: 2015-06-18

## 2013-03-28 NOTE — Progress Notes (Signed)
   POSTPARTUM VISIT NOTE  History:  38 y.o. [name] s/p SVD on 03/01/13 complicated with immediate Grade II PPH (discharge Hgb 7.0) and RLE DVT diagnosed on 03/13/13, here today for follow up.  I received a phone call on 03/26/13 from [employer] RN expressing concern that she was not taking her Xarelto as prescribed, not taking oral iron as prescribed, and that patient reports that she wants a tubal ligation. An appointment was scheduled for her today to discuss these issues.  A physical Nepali interpreter is present today.  Patient reports taking Xarelto three times a day and ran out of medications three days ago.  Unable to account for missing pills, 51 pills were in the starter pack.  She is bottle feeding, no problems with baby.  No other concerns.  Not sexually active.  The following portions of the patient's history were reviewed and updated as appropriate: allergies, current medications, past family history, past medical history, past social history, past surgical history and problem list.  Review of Systems:  Pertinent items are noted in HPI.  Objective:  Physical Exam LMP 03/13/2013 Gen: NAD Lungs: CTAB Heart: RRR Abd: Soft, nontender and nondistended Pelvic: Small uterus, NT, normal vulva and vagina Ext: Symmetric calves, NT bilaterally, no edema, no palpable cords  Assessment & Plan:  1. Postpartum deep vein thrombosis (DVT) of right lower leg I emphasized importance of taking the Xarelto as prescribed to prevent further DVT/PE which could be life-threatening; she needs to take this as prescribed for six months. It is unlikely she has run out of this medication after three days as she was given a 30 day starter pack.  She was re-prescribed 15 mg po bid x 3 weeks, then 20 mg po daily to complete six month therapy. Emphasized importance of taking medication as prescribed.  [facility] her anticoagulation.  2. Anemia, postpartum Encouraged  to take oral iron or iron rich food as recommended  3. Desires Sterilization Patient desires permanent sterilization via bilateral salpingectomy  Other reversible forms of contraception were discussed with patient; she declines all other modalities. Risks of procedure discussed with patient including but not limited to: risk of regret, permanence of method, bleeding, infection, injury to surrounding organs and need for additional procedures.  Failure risk of 1-2 % with increased risk of ectopic gestation if pregnancy occurs was also discussed with patient.  Patient verbalized understanding of these risks and wants to proceed with sterilization.  Medicaid papers signed today.   She was told that she will be contacted by our surgical scheduler regarding the time and date of her surgery; routine preoperative instructions of having nothing to eat or drink after midnight on the day prior to surgery and also coming to the hospital 1.5 hours prior to her time of surgery were also emphasized.  She was told she may be called for a preoperative appointment about a week prior to surgery and will be given further preoperative instructions at that visit. Printed patient education handouts (translated into Nepali using Google Translate) about the procedure were given to the patient to review at home.  Routine preventative health maintenance measures emphasized.   Sierra Blanchard  Sierra Amborn, MD, FACOG Attending Obstetrician & Gynecologist Faculty Practice, [facility]

## 2013-03-28 NOTE — Patient Instructions (Signed)
Patient has to take Xarelto 15mg  twice a day for three weeks (One prescription bottle) Then start the second bottle of 20 mg daily (Differnt prescription bottle with 4 refills) Has to take for a total of six months!!  This is for clot in right leg, can be life-threatening if not taken as prescribed  Iron pills can be taken twice a day.  This is for losing too much blood after delivery  Surgical scheduler will call with time and date for her surgery.   Laparoscopic BilateralTubal Removal/Laparoscopic Bilateral Salpingectomy Laparoscopic tubal removal is a procedure that removes the fallopian tubes at a time other than right after childbirth. By removing the fallopian tubes, the eggs that are released from the ovaries cannot enter the uterus and sperm cannot reach the egg. This is more effective than tubal ligation which is also known as getting your "tubes tied." Tubal removal is done so you will not be able to get pregnant or have a baby.  This procedure is permanent and irreversible. If you want to have future pregnancies, you should not have this procedure.  LET YOUR CAREGIVER KNOW ABOUT:  Allergies to food or medicine.  Medicines taken, including vitamins, herbs, eyedrops, over-the-counter medicines, and creams.  Use of steroids (by mouth or creams).  Previous problems with numbing medicines.  History of bleeding problems or blood clots.  Any recent colds or infections.  Previous surgery.  Other health problems, including diabetes and kidney problems.  Possibility of pregnancy, if this applies.  Any past pregnancies. RISKS AND COMPLICATIONS   Infection.  Bleeding.  Injury to surrounding organs.  Anesthetic side effects.  Failure of the procedure.  Ectopic pregnancy.  Future regret about having the procedure done. BEFORE THE PROCEDURE  Do not take aspirin or blood thinners a week before the procedure or as directed. This can cause bleeding.  Do not eat or drink  anything 6 to 8 hours before the procedure. PROCEDURE   You may be given a medicine to help you relax (sedative) before the procedure. You will be given a medicine to make you sleep (general anesthetic) during the procedure.  A tube will be put down your throat to help your breath while under general anesthesia.  Two small cuts (incisions) are made in the lower abdominal area and one incision is made near the belly button.  Your abdominal area will be inflated with a safe gas (carbon dioxide). This helps give the surgeon room to operate, visualize, and helps the surgeon avoid other organs.  A thin, lighted tube (laparoscope) with a camera attached is inserted into your abdomen through the incision near the belly button. Other small instruments are also inserted through the other abdominal incisions.  The fallopian tubes are located and are removed.  After the fallopian tubes are removed, the gas is released from the abdomen.  The incisions will be closed with stitches (sutures), and Dermabond. A bandage may be placed over the incisions. AFTER THE PROCEDURE   You will also have some mild abdominal discomfort for 3-7 days. You will be given pain medicine to ease any discomfort.  As long as there are no problems, you may be allowed to go home. Someone will need to drive you home and be with you for at least 24 hours once home.  You may have some mild discomfort in the throat. This is from the tube placed in your throat while you were sleeping.  You may experience discomfort in the shoulder area from some  trapped air between the liver and diaphragm. This sensation is normal and will slowly go away on its own. HOME CARE INSTRUCTIONS   Take all medicines as directed.  Only take over-the-counter or prescription medicines for pain, discomfort, or fever as directed by your caregiver.  Resume daily activities as directed.  Showers are preferred over baths.  You may resume sexual activities  in 1 week or as directed.  Do not drive while taking narcotics. SEEK MEDICAL CARE IF: .  There is increasing abdominal pain.  You feel lightheaded or faint.  You have the chills.  You have an oral temperature above 102 F (38.9 C).  There is pus-like (purulent) drainage from any of the wounds.  You are unable to pass gas or have a bowel movement.  You feel sick to your stomach (nauseous) or throw up (vomit). MAKE SURE YOU:   Understand these instructions.  Will watch your condition.  Will get help right away if you are not doing well or get worse.  ExitCare Patient Information 2013 ExitCare, [place].   ????????????? BilateralTubal ?????? / ????????????? ?????????? Salpingectomy ????????????? ?????? ???? ??? ????? ??? ????? ???? ?? ??? ?? fallopian ????? ?????? ????? ?? ????????? ??? ?? fallopian ????? ?????? ????, ?????? ???? ???? ????? ??? ????? ????????? ?????? ???? ?????? ? ???????? ????? ????? ??????? ?? ??? ????? ??? ?????? ?? ?????? ligation ????? ??? ?????????? ????? "????? ?????????" ????? ??????? ??????? ?? ?? ????? ? ????? ???? ??? ? ???? ???? ????? ?? ?? ????????? ?????? ? ??????????? ?? ????? ?????? ???????? ???? ?????????? ???, ????? ?? ????????? ??? ?????????? ??????? CAREGIVER ????: ???????? ?? ???? ???? ??????? ???? ???????, ???????, eyedrops, ????-the-??????? ????, ? ????? ????, ?????? (??? ?? ????? ??????) ??????? ?? ??????? Numbing ???? ??? ??????? ??????? ????????? ?????? ?? ??? ????? ?? ??????? ???? ??? ??? ????? ?? ???????? ??????? ??????? ?????? ? ???????? ?????? ???? ???? ????????? ??????,? ?????????? ?? ????????, ??? ?? ???? ?????? ???? ??? ??????? ????????? ????? ? ????????? ???????? ?????????? ?????? ?????? ???? ?????????? ??????????? ????????? ?? ??????? ???????? ??????????? ????? ????????? ???? ?????? ?????? ????????? ??? ???????? ??? ?? ??? ????????? ??? ?? ????????? ????? ?? ????? ???? ???? ?? ???????? ??? ????? ??? ?? ????????? ??? 8  ????? ???? 6 ???? ???? ????????? ????? ????????? ??? (????) ???? ??? ???? ?? ???? ?????? ??? ????? ????? ????????? ????? (??????? ??????????) ?? ???? ?? ???????? ??????? ?? ????? ????? ??????? ????? ???????? ????? ??? ??? ???? ????? ????? ?? ???????? ??? ???? ????? (?????) ????? ??? ??????? ?? ?????? ? ?? ???? ??? ??? ????? ????? ?? ??????? ??? ????????? ???????? ?????? (??????) ??? inflated ??????? ?? ???????, ?????? ???? ????? ???? ??? ??? ????, ? ????? ???? ?????? ????? ??? ????? ?????? ?? ???????? ??? ?? ?????, ???? ????? (laparoscope) ?????? ??? ????? ???? ?????? ?? ????? ??? ?? ???????? ?? ???? ???? ???? ??? ???? ??? ????? ?????? ???????? ???? ?? fallopian ????? ????? ?? ? ????? ???? ?? fallopian ????? ????? ??, ??? ?????? ??? ???? ???? ?? ?? ????? ????? (sutures), ? Dermabond ??? ???? ??????? ?? ????? ?? ????? ?? ??????? ??? ????? ????????? ??? ????? ??? 3-7 ??? ?? ???? ???? ????? ??? ??????? ?????? ????? ???? ??? ??????? ?? ???? ????? ???????? ??????? ?? ???? ???? ?????? ??? ?????, ????? ?? ??? ?????? ??? ????? ????? ?? ?? ??? ??????? 24 ????? ?? ???? ????? ??? ?? ???? ?????? ? ??? ?????? ?????? ????? ????? ???? ???? ??????? ??? ????? ?? ????? ????????? ???? ????? ????? ??????? ????? ???? ?? ????? ????? ? ????????? ??? ???? ??? ???? ???? ???? ??????? ?? ??????? ????? ??? ????? ?? ??????? ??????? ? ? ???????? ????? ???????? ??? ???? ???????? ????????? ????? ??? ???? ????????? ?????  caregiver ????????? ????? ????? ?????, ??????, ?? ????? ????-??-??????? ?? ?????? ???? ???? ????????? ????? ?????????? ???????????? ?????????? ??? ????? ?? ???? ????? ???? ????? 1 ????? ?? ????????? ????? ??? ???????????? ???? ???? ??? ????? ????? ???? ????? ???????????? ?????? ???? ??? ??????????:? ?????? ??? ????? ?? ????? lightheaded ?? ????? ????? ??????? ????? ??? ?? ????? 102  ?? (38.9  C) ???? ?? ????? ?????? ?? ?????? ?? ???? ??? ???? ????-????? (purulent) ?????? ?? ????? ?????? ????? ?? ??  ????? ???? ?????? ???? ????? ????? ??? ???? (????? ? ???) ?????? ????? ?? (??????) ????? ????? ?????????: ?? ??????????? ?????? ????? ?????? ???? ????? ?????? ????? ??? ??? ???????? ??? ?? ???? ??????? ??????  2013 [employer]  ExitCare ???? ??????

## 2013-03-28 NOTE — Telephone Encounter (Signed)
Patient called front desk, she was scheduled for an appt today with Dr. Macon LargeAnyanwu.

## 2013-04-01 ENCOUNTER — Encounter: Payer: Self-pay | Admitting: *Deleted

## 2013-04-29 ENCOUNTER — Ambulatory Visit (INDEPENDENT_AMBULATORY_CARE_PROVIDER_SITE_OTHER): Payer: Medicaid Other | Admitting: *Deleted

## 2013-04-29 DIAGNOSIS — Z3049 Encounter for surveillance of other contraceptives: Secondary | ICD-10-CM

## 2013-04-29 DIAGNOSIS — Z3200 Encounter for pregnancy test, result unknown: Secondary | ICD-10-CM

## 2013-04-29 LAB — POCT PREGNANCY, URINE: Preg Test, Ur: NEGATIVE

## 2013-04-29 NOTE — Progress Notes (Signed)
Patient showed up at window stating she was supposed to get a shot. Per chart review had postpartum visit and elected btl - scheduled for next week. No depoprovera since 2012, and at pp visit stated did not want shot, etc. Now states she does not want the surgery and wants the shot. Patient states she has had unprotected intercourse in the last few weeks. Informed patient via [employer] that we cannot give the shot today, will do pregnancy test today and then she must either abstain from intercourse or use condoms until she comes back in 2 weeks. Then we will repeat pregnancy test and can give depoprovera if both tests are negative. Mashelle voices understanding. Pregnancy test was negative today

## 2013-05-06 ENCOUNTER — Ambulatory Visit (HOSPITAL_COMMUNITY)
Admission: RE | Admit: 2013-05-06 | Payer: Medicaid Other | Source: Ambulatory Visit | Admitting: Obstetrics & Gynecology

## 2013-05-06 ENCOUNTER — Encounter (HOSPITAL_COMMUNITY): Admission: RE | Payer: Self-pay | Source: Ambulatory Visit

## 2013-05-06 SURGERY — SALPINGECTOMY, BILATERAL, LAPAROSCOPIC
Anesthesia: General | Laterality: Bilateral

## 2013-05-13 ENCOUNTER — Ambulatory Visit (INDEPENDENT_AMBULATORY_CARE_PROVIDER_SITE_OTHER): Payer: Medicaid Other | Admitting: *Deleted

## 2013-05-13 DIAGNOSIS — Z3049 Encounter for surveillance of other contraceptives: Secondary | ICD-10-CM

## 2013-05-13 LAB — POCT PREGNANCY, URINE: Preg Test, Ur: NEGATIVE

## 2013-05-13 MED ORDER — MEDROXYPROGESTERONE ACETATE 104 MG/0.65ML ~~LOC~~ SUSP
104.0000 mg | Freq: Once | SUBCUTANEOUS | Status: AC
  Administered 2013-05-13: 104 mg via SUBCUTANEOUS

## 2013-05-13 NOTE — Progress Notes (Signed)
Used [employer]. Naly here for depoprovera- states she has not had intercourse since upt 2 weeks ago, today upt negative.  Will start depoprovera today.

## 2013-06-24 ENCOUNTER — Encounter: Payer: Self-pay | Admitting: *Deleted

## 2013-08-05 ENCOUNTER — Ambulatory Visit (INDEPENDENT_AMBULATORY_CARE_PROVIDER_SITE_OTHER): Payer: Medicaid Other

## 2013-08-05 VITALS — BP 132/86 | HR 76 | Temp 98.3°F | Ht 60.0 in | Wt 147.0 lb

## 2013-08-05 DIAGNOSIS — Z3049 Encounter for surveillance of other contraceptives: Secondary | ICD-10-CM

## 2013-08-05 MED ORDER — MEDROXYPROGESTERONE ACETATE 104 MG/0.65ML ~~LOC~~ SUSP
104.0000 mg | Freq: Once | SUBCUTANEOUS | Status: AC
  Administered 2013-08-05: 104 mg via SUBCUTANEOUS

## 2013-08-05 NOTE — Progress Notes (Signed)
Patient here for second dose of depo 104mg . Injection administered subcutaneously to left anterior thigh. Patient tolerated well. Patient to return in 3 months --- 8/30-9/14---for next injection.

## 2013-08-07 NOTE — Progress Notes (Signed)
Patient ID: Sierra Blanchard, female   DOB: 07/26/1975, 38 y.o.   MRN: [id number] Agree with nurses's documentation of this patient's clinic encounter.  Allie Bossier, MD

## 2013-10-28 ENCOUNTER — Ambulatory Visit (INDEPENDENT_AMBULATORY_CARE_PROVIDER_SITE_OTHER): Payer: Self-pay | Admitting: *Deleted

## 2013-10-28 VITALS — BP 116/50 | HR 79 | Wt 144.3 lb

## 2013-10-28 DIAGNOSIS — Z3049 Encounter for surveillance of other contraceptives: Secondary | ICD-10-CM

## 2013-10-28 MED ORDER — MEDROXYPROGESTERONE ACETATE 104 MG/0.65ML ~~LOC~~ SUSP
104.0000 mg | Freq: Once | SUBCUTANEOUS | Status: AC
  Administered 2013-10-28: 104 mg via SUBCUTANEOUS

## 2013-10-28 NOTE — Progress Notes (Signed)
Used Interpreter  Prasamsa Sharma.  

## 2013-12-30 ENCOUNTER — Encounter: Payer: Self-pay | Admitting: Obstetrics & Gynecology

## 2014-01-20 ENCOUNTER — Ambulatory Visit: Payer: Self-pay

## 2015-06-18 ENCOUNTER — Encounter: Payer: Self-pay | Admitting: Family Medicine

## 2015-06-18 ENCOUNTER — Ambulatory Visit (INDEPENDENT_AMBULATORY_CARE_PROVIDER_SITE_OTHER): Payer: Medicaid Other | Admitting: Family Medicine

## 2015-06-18 ENCOUNTER — Other Ambulatory Visit (HOSPITAL_COMMUNITY)
Admission: RE | Admit: 2015-06-18 | Discharge: 2015-06-18 | Disposition: A | Discharge status: Home / Self Care | Payer: Medicaid Other | Source: Ambulatory Visit | Attending: Family Medicine | Admitting: Family Medicine

## 2015-06-18 VITALS — BP 111/89 | HR 84 | Temp 98.7°F | Wt 145.9 lb

## 2015-06-18 DIAGNOSIS — Z789 Other specified health status: Secondary | ICD-10-CM

## 2015-06-18 DIAGNOSIS — Z113 Encounter for screening for infections with a predominantly sexual mode of transmission: Secondary | ICD-10-CM

## 2015-06-18 DIAGNOSIS — Z86718 Personal history of other venous thrombosis and embolism: Secondary | ICD-10-CM | POA: Insufficient documentation

## 2015-06-18 DIAGNOSIS — O0992 Supervision of high risk pregnancy, unspecified, second trimester: Secondary | ICD-10-CM

## 2015-06-18 DIAGNOSIS — O09529 Supervision of elderly multigravida, unspecified trimester: Secondary | ICD-10-CM | POA: Insufficient documentation

## 2015-06-18 DIAGNOSIS — Z1151 Encounter for screening for human papillomavirus (HPV): Secondary | ICD-10-CM

## 2015-06-18 DIAGNOSIS — Z23 Encounter for immunization: Secondary | ICD-10-CM

## 2015-06-18 DIAGNOSIS — O09522 Supervision of elderly multigravida, second trimester: Secondary | ICD-10-CM | POA: Diagnosis not present

## 2015-06-18 DIAGNOSIS — Z124 Encounter for screening for malignant neoplasm of cervix: Secondary | ICD-10-CM | POA: Diagnosis not present

## 2015-06-18 DIAGNOSIS — O0990 Supervision of high risk pregnancy, unspecified, unspecified trimester: Secondary | ICD-10-CM | POA: Insufficient documentation

## 2015-06-18 LAB — POCT PREGNANCY, URINE: Preg Test, Ur: POSITIVE — AB

## 2015-06-18 LAB — POCT URINALYSIS DIP (DEVICE)
Bilirubin Urine: NEGATIVE
Glucose, UA: NEGATIVE mg/dL
HGB URINE DIPSTICK: NEGATIVE
KETONES UR: NEGATIVE mg/dL
Leukocytes, UA: NEGATIVE
Nitrite: NEGATIVE
Protein, ur: NEGATIVE mg/dL
SPECIFIC GRAVITY, URINE: 1.02 (ref 1.005–1.030)
Urobilinogen, UA: 0.2 mg/dL (ref 0.0–1.0)
pH: 7 (ref 5.0–8.0)

## 2015-06-18 MED ORDER — ENOXAPARIN SODIUM 40 MG/0.4ML ~~LOC~~ SOLN: 40.0000 mg | SUBCUTANEOUS | Status: DC

## 2015-06-18 NOTE — Progress Notes (Signed)
Nutrition Note: 1st visit consult Pt has gained 2 # 14 oz @ 9358w2d which is < expected. Pt reports eating 3 meals a day. Pt is not taking PNV. Pt reports no N&V or heartburn. NKFA. Pt received verbal information on general nutrition during pregnancy. Interpreter used to communicate. Pt plans to try and BF if able.  Pt does not have WIC but plans to apply.  F/u as needed. Sierra Mannerebekah Ashleymarie Granderson, MS RD LDN

## 2015-06-18 NOTE — Progress Notes (Signed)
Due to patient availability and transportation patient unable to come for Genetic Counseling and U/S until May 12th at 10am with MFM.

## 2015-06-18 NOTE — Progress Notes (Signed)
Here for initial prenatal visit. States stopped xarelto after 7 months as instructed. Positive pregnancy test today. Used [employer]#[id number].c/o pain and cramping sometimes in left leg.
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
## 2015-06-19 LAB — PRESCRIPTION MONITORING PROFILE (19 PANEL)
Amphetamine/Meth: NEGATIVE ng/mL
Barbiturate Screen, Urine: NEGATIVE ng/mL
Benzodiazepine Screen, Urine: NEGATIVE ng/mL
Buprenorphine, Urine: NEGATIVE ng/mL
CARISOPRODOL, URINE: NEGATIVE ng/mL
Cannabinoid Scrn, Ur: NEGATIVE ng/mL
Cocaine Metabolites: NEGATIVE ng/mL
Creatinine, Urine: 70.99 mg/dL (ref >20.0)
Fentanyl, Ur: NEGATIVE ng/mL
MDMA URINE: NEGATIVE ng/mL
Meperidine, Ur: NEGATIVE ng/mL
Methadone Screen, Urine: NEGATIVE ng/mL
Methaqualone: NEGATIVE ng/mL
Nitrites, Initial: NEGATIVE ug/mL
OXYCODONE SCRN UR: NEGATIVE ng/mL
Opiate Screen, Urine: NEGATIVE ng/mL
Phencyclidine, Ur: NEGATIVE ng/mL
Propoxyphene: NEGATIVE ng/mL
TAPENTADOLUR: NEGATIVE ng/mL
Tramadol Scrn, Ur: NEGATIVE ng/mL
Zolpidem, Urine: NEGATIVE ng/mL
pH, Initial: 7.5 pH (ref 4.5–8.9)

## 2015-06-19 LAB — PRENATAL PROFILE (SOLSTAS)
Antibody Screen: NEGATIVE
BASOS ABS: 0 {cells}/uL (ref 0–200)
Basophils Relative: 0 %
EOS ABS: 1110 {cells}/uL — AB (ref 15–500)
Eosinophils Relative: 10 %
HEMATOCRIT: 34.6 % — AB (ref 35.0–45.0)
HEP B S AG: NEGATIVE
HIV: NONREACTIVE
Hemoglobin: 11.6 g/dL — ABNORMAL LOW (ref 11.7–15.5)
Lymphocytes Relative: 16 %
Lymphs Abs: 1776 cells/uL (ref 850–3900)
MCH: 32.2 pg (ref 27.0–33.0)
MCHC: 33.5 g/dL (ref 32.0–36.0)
MCV: 96.1 fL (ref 80.0–100.0)
MONO ABS: 444 {cells}/uL (ref 200–950)
MPV: 10.2 fL (ref 7.5–12.5)
Monocytes Relative: 4 %
NEUTROS PCT: 70 %
Neutro Abs: 7770 cells/uL (ref 1500–7800)
Platelets: 211 10*3/uL (ref 140–400)
RBC: 3.6 MIL/uL — ABNORMAL LOW (ref 3.80–5.10)
RDW: 13.8 % (ref 11.0–15.0)
Rh Type: POSITIVE
Rubella: 3.33 Index — ABNORMAL HIGH (ref <0.90)
WBC: 11.1 10*3/uL — ABNORMAL HIGH (ref 3.8–10.8)

## 2015-06-19 LAB — GC/CHLAMYDIA PROBE AMP (~~LOC~~) NOT AT ARMC
Chlamydia: NEGATIVE
Neisseria Gonorrhea: NEGATIVE

## 2015-06-19 LAB — CULTURE, OB URINE
Colony Count: NO GROWTH
ORGANISM ID, BACTERIA: NO GROWTH

## 2015-07-01 ENCOUNTER — Encounter: Payer: Self-pay | Admitting: *Deleted

## 2015-07-03 ENCOUNTER — Encounter (HOSPITAL_COMMUNITY): Payer: Medicaid Other

## 2015-07-03 ENCOUNTER — Other Ambulatory Visit (HOSPITAL_COMMUNITY): Payer: Medicaid Other

## 2015-07-10 ENCOUNTER — Ambulatory Visit (HOSPITAL_COMMUNITY)
Admission: RE | Admit: 2015-07-10 | Discharge: 2015-07-10 | Disposition: A | Discharge status: Home / Self Care | Payer: Medicaid Other | Source: Ambulatory Visit | Attending: Family Medicine | Admitting: Family Medicine

## 2015-07-10 ENCOUNTER — Ambulatory Visit (HOSPITAL_COMMUNITY): Admission: RE | Admit: 2015-07-10 | Payer: Medicaid Other | Source: Ambulatory Visit

## 2015-07-10 DIAGNOSIS — O09522 Supervision of elderly multigravida, second trimester: Secondary | ICD-10-CM

## 2015-07-10 DIAGNOSIS — O0992 Supervision of high risk pregnancy, unspecified, second trimester: Secondary | ICD-10-CM

## 2015-07-10 DIAGNOSIS — O09529 Supervision of elderly multigravida, unspecified trimester: Secondary | ICD-10-CM

## 2015-07-10 NOTE — Progress Notes (Signed)
Genetic Counseling  High-Risk Gestation Note  Appointment Date:  07/10/2015 Referred By: Levie HeritageStinson, Sierra J, DO Date of Birth:  06/15/1975   Pregnancy History: [id number] Estimated Date of Delivery: 09/29/15 Estimated Gestational Age: 3062w3d Attending: Particia NearingMartha Decker, MD  [name] was seen for genetic counseling because of a maternal age of 40 y.o..  Nepali/English Language Resources interpreter, Sierra AntisLorna, provided interpretation for today's visit.   In summary:   Discussed AMA and associated risk for fetal aneuploidy  Discussed options for screening  NIPS-patient wants to pursue NIPS but her child may blood draw technically challenging today; Patient deferred to next visit  Ultrasound-patient rescheduled her ultrasound to 5/23, given that she did not have childcare for her toddler today  Discussed diagnostic testing options  Amniocentesis-patient declined  Reviewed family history- unremarkable  Carrier screening not discussed today due to time constraints  She was counseled regarding maternal age and the association with risk for chromosome conditions due to nondisjunction with aging of the ova.   We reviewed chromosomes, nondisjunction, and the associated 1 in  risk for fetal aneuploidy related to a maternal age of 40 y.o. at 5862w3d gestation.  She was/They were counseled that the risk for aneuploidy decreases as gestational age increases, accounting for those pregnancies which spontaneously abort.  We specifically discussed Down syndrome (trisomy 1721), trisomies 6313 and 4118, and sex chromosome aneuploidies (47,XXX and 47,XXY) including the common features and prognoses of each.   We reviewed available screening options including noninvasive prenatal screening (NIPS)/cell free DNA (cfDNA) screening and detailed ultrasound.  She was counseled that screening tests are used to modify a patient's a priori risk for aneuploidy, typically based on age. This estimate provides a pregnancy specific  risk assessment. We reviewed the benefits and limitations of each option. Specifically, we discussed the conditions for which each test screens, the detection rates, and false positive rates of each. She was/They were also counseled regarding diagnostic testing via amniocentesis. We reviewed the approximate 1 in 300-500 risk for complications from amniocentesis, including spontaneous preterm labor and delivery. We discussed the possible results that the tests might provide including: positive, negative, unanticipated, and no result. Finally, they were counseled regarding the cost of each option and potential out of pocket expenses. After consideration of all the options, she elected to proceed with NIPS and declined amniocentesis.  The patient's young child made blood draw physically and technically challenging today; thus, the patient declined NIPS blood draw today and asked to defer this until her next visit in our office.     Detailed ultrasound is scheduled for 07/21/15. This was rescheduled given that the patient did not have available childcare. We discussed that screening tests cannot rule out all birth defects or genetic syndromes. The patient was advised of this limitation.   Both family histories were reviewed and found to be noncontributory for birth defects, intellectual disability, and known genetic conditions. Without further information regarding the provided family history, an accurate genetic risk cannot be calculated. Further genetic counseling is warranted if more information is obtained.  [name] denied exposure to environmental toxins or chemical agents. She denied the use of alcohol, tobacco or street drugs. She denied significant viral illnesses during the course of her pregnancy. Her medical and surgical histories were contributory for blood clots following her last delivery. She is scheduled for MFM consultation on 07/21/15.   I counseled [name] regarding the above  risks and available options.  The approximate face-to-face time with the  genetic counselor was 50 minutes.  Quinn Plowman, MS,  Certified Genetic Counselor 07/10/2015

## 2015-07-15 ENCOUNTER — Encounter (HOSPITAL_COMMUNITY): Payer: Self-pay | Admitting: Family Medicine

## 2015-07-20 ENCOUNTER — Ambulatory Visit (INDEPENDENT_AMBULATORY_CARE_PROVIDER_SITE_OTHER): Payer: Medicaid Other | Admitting: Obstetrics and Gynecology

## 2015-07-20 ENCOUNTER — Encounter: Payer: Self-pay | Admitting: Obstetrics and Gynecology

## 2015-07-20 VITALS — BP 117/48 | HR 79 | Wt 148.2 lb

## 2015-07-20 DIAGNOSIS — O09523 Supervision of elderly multigravida, third trimester: Secondary | ICD-10-CM

## 2015-07-20 DIAGNOSIS — O0993 Supervision of high risk pregnancy, unspecified, third trimester: Secondary | ICD-10-CM

## 2015-07-20 DIAGNOSIS — Z789 Other specified health status: Secondary | ICD-10-CM | POA: Diagnosis not present

## 2015-07-20 DIAGNOSIS — Z86718 Personal history of other venous thrombosis and embolism: Secondary | ICD-10-CM

## 2015-07-20 LAB — CBC
HCT: 35.5 % (ref 35.0–45.0)
HEMOGLOBIN: 11.7 g/dL (ref 11.7–15.5)
MCH: 31.6 pg (ref 27.0–33.0)
MCHC: 33 g/dL (ref 32.0–36.0)
MCV: 95.9 fL (ref 80.0–100.0)
MPV: 10.6 fL (ref 7.5–12.5)
Platelets: 193 10*3/uL (ref 140–400)
RBC: 3.7 MIL/uL — AB (ref 3.80–5.10)
RDW: 13.2 % (ref 11.0–15.0)
WBC: 12.6 10*3/uL — AB (ref 3.8–10.8)

## 2015-07-20 LAB — POCT URINALYSIS DIP (DEVICE)
BILIRUBIN URINE: NEGATIVE
GLUCOSE, UA: NEGATIVE mg/dL
Hgb urine dipstick: NEGATIVE
KETONES UR: NEGATIVE mg/dL
Nitrite: NEGATIVE
Protein, ur: NEGATIVE mg/dL
SPECIFIC GRAVITY, URINE: 1.02 (ref 1.005–1.030)
Urobilinogen, UA: 0.2 mg/dL (ref 0.0–1.0)
pH: 7 (ref 5.0–8.0)

## 2015-07-20 MED ORDER — ENOXAPARIN SODIUM 40 MG/0.4ML ~~LOC~~ SOLN: 40.0000 mg | SUBCUTANEOUS | Status: DC

## 2015-07-20 NOTE — Progress Notes (Signed)
Pt reports left leg pain  Swelling in feet

## 2015-07-20 NOTE — Progress Notes (Signed)
Subjective:  Sierra Blanchard is a 40 y.o. G6P5005 at 6372w6d being seen today for ongoing prenatal care.  She is currently monitored for the following issues for this high-risk pregnancy and has Asthma in adult; Language barrier, speaks Nepali only; Anemia, postpartum; Supervision of high risk pregnancy, antepartum; AMA (advanced maternal age) multigravida 35+; and History of DVT (deep vein thrombosis) on her problem list.  Patient reports left buttock pain which shoots down her left leg and makes it difficult to ambulate and sleep.  Contractions: Not present. Vag. Bleeding: None.  Movement: Present. Denies leaking of fluid.   The following portions of the patient's history were reviewed and updated as appropriate: allergies, current medications, past family history, past medical history, past social history, past surgical history and problem list. Problem list updated.  Objective:   Filed Vitals:   07/20/15 1020  BP: 117/48  Pulse: 79  Weight: 148 lb 3.2 oz (67.223 kg)    Fetal Status: Fetal Heart Rate (bpm): 140 Fundal Height: 30 cm Movement: Present     General:  Alert, oriented and cooperative. Patient is in no acute distress.  Skin: Skin is warm and dry. No rash noted.   Cardiovascular: Normal heart rate noted  Respiratory: Normal respiratory effort, no problems with respiration noted  Abdomen: Soft, gravid, appropriate for gestational age. Pain/Pressure: Present     Pelvic: Vag. Bleeding: None     Cervical exam deferred        Extremities: Normal range of motion.  Edema: None equal in size and non tender  Mental Status: Normal mood and affect. Normal behavior. Normal judgment and thought content.   Urinalysis: Urine Protein: Negative Urine Glucose: Negative  Assessment and Plan:  Pregnancy: G6P5005 at 5272w6d  1. Supervision of high risk pregnancy, antepartum, third trimester Patient is doing well Symptoms are likely related to sciatic pain. Discussed the use of a maternity support  belt to help relieve some of the symptoms Third trimester lab today - Glucose Tolerance, 1 HR (50g) - RPR - CBC - HIV antibody (with reflex)  2. Language barrier, speaks Nepali only Video interpreter used  3. AMA (advanced maternal age) multigravida 35+, third trimester NIPS tomorrow with MFM  4. History of DVT (deep vein thrombosis) Patient has not been taking her prescribed lovenox as she was afraid to take it. Explained to the patient the reasons why it was prescribed and the risks of not taking it. Patient verbalized understanding and is able to return tomorrow for teaching  Preterm labor symptoms and general obstetric precautions including but not limited to vaginal bleeding, contractions, leaking of fluid and fetal movement were reviewed in detail with the patient. Please refer to After Visit Summary for other counseling recommendations.  Return in about 2 weeks (around 08/03/2015).   Catalina AntiguaPeggy Amiya Escamilla, MD

## 2015-07-21 ENCOUNTER — Ambulatory Visit (HOSPITAL_COMMUNITY): Payer: Medicaid Other

## 2015-07-21 ENCOUNTER — Encounter (HOSPITAL_COMMUNITY): Payer: Medicaid Other

## 2015-07-21 ENCOUNTER — Other Ambulatory Visit: Payer: Self-pay | Admitting: Family Medicine

## 2015-07-21 ENCOUNTER — Ambulatory Visit (HOSPITAL_COMMUNITY)
Admission: RE | Admit: 2015-07-21 | Discharge: 2015-07-21 | Disposition: A | Discharge status: Home / Self Care | Payer: Medicaid Other | Source: Ambulatory Visit | Attending: Family Medicine | Admitting: Family Medicine

## 2015-07-21 ENCOUNTER — Ambulatory Visit (HOSPITAL_COMMUNITY)
Admission: RE | Admit: 2015-07-21 | Discharge: 2015-07-21 | Disposition: A | Discharge status: Home / Self Care | Payer: Medicaid Other | Source: Ambulatory Visit | Attending: Obstetrics and Gynecology | Admitting: Obstetrics and Gynecology

## 2015-07-21 ENCOUNTER — Ambulatory Visit (INDEPENDENT_AMBULATORY_CARE_PROVIDER_SITE_OTHER): Payer: Medicaid Other

## 2015-07-21 ENCOUNTER — Encounter (HOSPITAL_COMMUNITY): Payer: Self-pay

## 2015-07-21 DIAGNOSIS — O09523 Supervision of elderly multigravida, third trimester: Secondary | ICD-10-CM

## 2015-07-21 DIAGNOSIS — Z86718 Personal history of other venous thrombosis and embolism: Secondary | ICD-10-CM | POA: Insufficient documentation

## 2015-07-21 DIAGNOSIS — Z3A30 30 weeks gestation of pregnancy: Secondary | ICD-10-CM | POA: Insufficient documentation

## 2015-07-21 DIAGNOSIS — Z1389 Encounter for screening for other disorder: Secondary | ICD-10-CM

## 2015-07-21 DIAGNOSIS — O09529 Supervision of elderly multigravida, unspecified trimester: Secondary | ICD-10-CM

## 2015-07-21 DIAGNOSIS — Z363 Encounter for antenatal screening for malformations: Secondary | ICD-10-CM

## 2015-07-21 DIAGNOSIS — Z36 Encounter for antenatal screening of mother: Secondary | ICD-10-CM | POA: Diagnosis not present

## 2015-07-21 DIAGNOSIS — Z8759 Personal history of other complications of pregnancy, childbirth and the puerperium: Secondary | ICD-10-CM

## 2015-07-21 DIAGNOSIS — O09293 Supervision of pregnancy with other poor reproductive or obstetric history, third trimester: Secondary | ICD-10-CM | POA: Diagnosis not present

## 2015-07-21 DIAGNOSIS — Z7901 Long term (current) use of anticoagulants: Secondary | ICD-10-CM

## 2015-07-21 DIAGNOSIS — O0993 Supervision of high risk pregnancy, unspecified, third trimester: Secondary | ICD-10-CM

## 2015-07-21 LAB — HIV ANTIBODY (ROUTINE TESTING W REFLEX): HIV: NONREACTIVE

## 2015-07-21 LAB — GLUCOSE TOLERANCE, 1 HOUR (50G) W/O FASTING: Glucose, 1 Hr, gestational: 138 mg/dL (ref <140)

## 2015-07-21 MED ORDER — ENOXAPARIN SODIUM 150 MG/ML ~~LOC~~ SOLN: 40.0000 mg | Freq: Once | SUBCUTANEOUS | Status: DC

## 2015-07-21 NOTE — Consult Note (Signed)
MFM consult    40 yr old G6P5005 at 4168w0d with history of DVT referred by Dr. Adrian BlackwaterStinson for fetal anatomic survey and consult.  Past OB hx: 5 full term SVD PMH: developed a DVT postpartum in a prior pregnancy  Ultrasound today shows: Single intrauterine pregnancy. Estimated fetal weight is in the 75th%.  Posterior placenta without evidence of previa. Normal amniotic fluid index. Normal transabdominal cervical length. The views of the hands are limited. The remainder of the fetal anatomic survey is normal.  I counseled the patient as follows: 1. Appropriate fetal growth. 2. Normal fetal anatomic survey: - views of hands are limited - discussed limitations of ultrasound in detecting fetal anomalies 3. Advanced maternal age: - previously counseled - desires cell free fetal DNA which was drawn today - recommend fetal every 4 weeks - recommend start antenatal testing at 32 weeks - recommend delivery by estimated due date - recommend fetal kick counts - discussed increased risk of stillbirth, fetal growth restriction, and preeclampsia 4. History of deep venous thrombosis: - discussed pregnancy is a risk factor for venous thromboembolism- highest risk period is postpartum - discussed increased recurrence risk - recommend obtain thrombophilia panel (anticardiolipin antibodies, lupus anti coagluant, beta 2 glycoprotein, factor V, prothrombin gene mutation, antithrombin III, protein S. and protein C - recommend start lovenox today 40mg  qd and continue until at least 6 weeks postpartum- recommendations may change pending thrombophilia panel; if additional risk factors develop (need for C section) consider full anticoagulation therapy postpartum - recommend obtain CBC in 1 week and in 4 weeks to check platelet levels - recommend start lovenox 6 hours after vaginal delivery or 12 hours post C section - recommend sequential compression devices in labor - consider inducing at 39 weeks and holding  lovenox 24 hours prior - discussed if patient is having regular contractions, leaking of fluid, vaginal bleeding, or decreased fetal movement she should hold her lovenox and be evaluated by her OB - discussed small increased risk of bleeding and regional anesthesia is not possible within 24 hours after taking lovenox Recommend close surveillance for the development of signs/symptoms of thromboembolism  I spent a total of 45 minutes with the patient of which >50% was in face to face consultation.  Eulis FosterKristen Mykel Sponaugle, MD

## 2015-07-21 NOTE — Progress Notes (Signed)
Pt presented today for a medication consult regarding Lovenox of which she was instructed to start taking but has not. I was able to use [employer] interpreter to help with language barrier because patient speaks [place]. Pt stated she is afraid to give herself this injection and has not being taken as prescribed. I reviewed with patient the routes to administer as well how. After spending thirty minute with patient she is still afraid to give her medicine. After speaking with my AD I was instructed to give her the Lovenox 40 mg SQ  injected into right side abdomen at 12:15.. Pt tolerated well and will return tomorrow for her next dose. The office is trying to accommodate patient so that we can give her injection at the office until she feels more comfortable. I have called [facility] to see if they would be able to go out to patient house to give this injection. They will not be able to do this do to staff coverage. Pt has requested to leave one dose of medication here at this office. This has been lock in the cabinet.

## 2015-07-21 NOTE — Addendum Note (Signed)
Addended by: Cheree DittoGRAHAM, Kierre Deines A on: 07/21/2015 05:04 PM   Modules accepted: Orders

## 2015-07-22 ENCOUNTER — Encounter: Payer: Self-pay | Admitting: *Deleted

## 2015-07-22 ENCOUNTER — Ambulatory Visit: Payer: Medicaid Other | Admitting: *Deleted

## 2015-07-22 DIAGNOSIS — O0993 Supervision of high risk pregnancy, unspecified, third trimester: Secondary | ICD-10-CM

## 2015-07-22 LAB — RPR

## 2015-07-22 NOTE — Progress Notes (Signed)
Extensive education provided to pt by this RN and Ladona Ridgelaylor (pharmacist) for self administration of Lovenox injections using [employer] interpreters Deo and Hari # [id number]. With hands on assistance pt was able to administer injection. She would like to continue to have assistance with her injections until she feels comfortable to give them by herself. She will return tomorrow as scheduled.

## 2015-07-23 ENCOUNTER — Ambulatory Visit: Payer: Medicaid Other | Admitting: *Deleted

## 2015-07-23 DIAGNOSIS — Z86718 Personal history of other venous thrombosis and embolism: Secondary | ICD-10-CM

## 2015-07-24 ENCOUNTER — Encounter: Payer: Self-pay | Admitting: *Deleted

## 2015-07-28 ENCOUNTER — Ambulatory Visit: Payer: Medicaid Other | Admitting: General Practice

## 2015-07-28 DIAGNOSIS — Z719 Counseling, unspecified: Secondary | ICD-10-CM

## 2015-07-28 NOTE — Progress Notes (Signed)
[employer] video interpreter Audery Amel(Hari) used for visit. Pt was able to self administer Lovenox injection with minimal assistance from RN.  Pt stated she will try to administer injections over the next 4 days (Holiday weekend). She was advised to return on 5/30 if needed for assistance or if she had questions.  Pt voiced understanding.

## 2015-07-28 NOTE — Progress Notes (Signed)
Used video interpreter [phone]#[phone] for encounter. Asked patient how her injections have been over the weekend since she has been doing them by herself. Patient states it is going fine. States she broke out in little itchy bumps all over since Sunday. Asked patient if she started using new soaps or detergents or eating new foods. Patient states she did switch her soap. Recommended she discontinuing using & try a couple doses of benadryl. Patient verbalized understanding and is aware of next appt.

## 2015-07-29 ENCOUNTER — Ambulatory Visit: Payer: Medicaid Other

## 2015-07-29 ENCOUNTER — Telehealth: Payer: Self-pay | Admitting: General Practice

## 2015-07-29 NOTE — Telephone Encounter (Signed)
Patient needs 3 hr gtt. Called patient with pacific interpreter [phone], no answer- left message to call us back at the clinics for results.

## 2015-07-30 ENCOUNTER — Other Ambulatory Visit (HOSPITAL_COMMUNITY): Payer: Self-pay

## 2015-07-30 ENCOUNTER — Ambulatory Visit: Payer: Medicaid Other

## 2015-07-30 ENCOUNTER — Telehealth (HOSPITAL_COMMUNITY): Payer: Self-pay | Admitting: MS"

## 2015-07-30 NOTE — Telephone Encounter (Signed)
Attempted to call patient regarding results of prenatal cell free DNA testing, which are within normal range. Left message via [facility] [phone] that I was calling with good news regarding results of genetic testing.   Clydie BraunKaren Amaziah Ghosh 07/30/2015 2:40 PM

## 2015-08-03 ENCOUNTER — Ambulatory Visit (INDEPENDENT_AMBULATORY_CARE_PROVIDER_SITE_OTHER): Payer: Medicaid Other | Admitting: Obstetrics and Gynecology

## 2015-08-03 ENCOUNTER — Ambulatory Visit: Payer: Medicaid Other

## 2015-08-03 VITALS — BP 109/56 | HR 78 | Wt 148.2 lb

## 2015-08-03 DIAGNOSIS — Z86718 Personal history of other venous thrombosis and embolism: Secondary | ICD-10-CM

## 2015-08-03 DIAGNOSIS — O0990 Supervision of high risk pregnancy, unspecified, unspecified trimester: Secondary | ICD-10-CM

## 2015-08-03 DIAGNOSIS — O09523 Supervision of elderly multigravida, third trimester: Secondary | ICD-10-CM

## 2015-08-03 DIAGNOSIS — Z23 Encounter for immunization: Secondary | ICD-10-CM

## 2015-08-03 DIAGNOSIS — Z559 Problems related to education and literacy, unspecified: Secondary | ICD-10-CM | POA: Diagnosis not present

## 2015-08-03 LAB — POCT URINALYSIS DIP (DEVICE)
Bilirubin Urine: NEGATIVE
Glucose, UA: NEGATIVE mg/dL
HGB URINE DIPSTICK: NEGATIVE
Nitrite: NEGATIVE
PH: 6.5 (ref 5.0–8.0)
PROTEIN: 30 mg/dL — AB
SPECIFIC GRAVITY, URINE: 1.025 (ref 1.005–1.030)
UROBILINOGEN UA: 1 mg/dL (ref 0.0–1.0)

## 2015-08-03 NOTE — Progress Notes (Signed)
Patient is taking lovenox injections without problems

## 2015-08-03 NOTE — Progress Notes (Signed)
Subjective:  Sierra Blanchard is a 40 y.o. G6P5005 at 6523w6d being seen today for ongoing prenatal care.  She is currently monitored for the following issues for this high-risk pregnancy and has Asthma in adult; Language barrier, speaks Nepali only; Anemia, postpartum; Supervision of high risk pregnancy, antepartum; AMA (advanced maternal age) multigravida 35+; and History of DVT (deep vein thrombosis) on her problem list.  Patient reports no complaints.  Contractions: Not present. Vag. Bleeding: None.  Movement: Present. Denies leaking of fluid.   The following portions of the patient's history were reviewed and updated as appropriate: allergies, current medications, past family history, past medical history, past social history, past surgical history and problem list. Problem list updated.  Objective:   Filed Vitals:   08/03/15 1608  Weight: 148 lb 3.2 oz (67.223 kg)    Fetal Status: Fetal Heart Rate (bpm): 150   Movement: Present     General:  Alert, oriented and cooperative. Patient is in no acute distress.  Skin: Skin is warm and dry. No rash noted.   Cardiovascular: Normal heart rate noted  Respiratory: Normal respiratory effort, no problems with respiration noted  Abdomen: Soft, gravid, appropriate for gestational age. Pain/Pressure: Present     Pelvic: Vag. Bleeding: None     Cervical exam deferred        Extremities: Normal range of motion.  Edema: Trace  Mental Status: Normal mood and affect. Normal behavior. Normal judgment and thought content.   Urinalysis:      Assessment and Plan:  Pregnancy: G6P5005 at 4823w6d  1. Supervision of high risk pregnancy, antepartum, unspecified trimester - tdap today - lengthy discussion of btl today, not prepared to sign papers despite explaining it doesn't obligate a btl - u/s for growth 2 days  2. History of DVT (deep vein thrombosis) - thrombophilia panel w/ 3-hour gtt. F/u for that asap  Preterm labor symptoms and general obstetric  precautions including but not limited to vaginal bleeding, contractions, leaking of fluid and fetal movement were reviewed in detail with the patient. Please refer to After Visit Summary for other counseling recommendations.    Kathrynn RunningNoah Bedford Haru Shaff, MD

## 2015-08-03 NOTE — Telephone Encounter (Signed)
Called Vaishnavi with [employer] [phone] at both home and mobile phone numbers and left a message we are calling with some information- please call clinic.

## 2015-08-04 ENCOUNTER — Ambulatory Visit: Payer: Medicaid Other

## 2015-08-05 ENCOUNTER — Encounter (HOSPITAL_COMMUNITY): Payer: Self-pay

## 2015-08-05 ENCOUNTER — Ambulatory Visit: Payer: Medicaid Other

## 2015-08-05 ENCOUNTER — Other Ambulatory Visit (HOSPITAL_COMMUNITY): Payer: Self-pay | Admitting: Obstetrics and Gynecology

## 2015-08-05 ENCOUNTER — Ambulatory Visit (HOSPITAL_COMMUNITY)
Admission: RE | Admit: 2015-08-05 | Discharge: 2015-08-05 | Disposition: A | Discharge status: Home / Self Care | Payer: Medicaid Other | Source: Ambulatory Visit | Attending: Family Medicine | Admitting: Family Medicine

## 2015-08-05 DIAGNOSIS — O09529 Supervision of elderly multigravida, unspecified trimester: Secondary | ICD-10-CM

## 2015-08-05 DIAGNOSIS — O09523 Supervision of elderly multigravida, third trimester: Secondary | ICD-10-CM | POA: Diagnosis not present

## 2015-08-05 DIAGNOSIS — Z86718 Personal history of other venous thrombosis and embolism: Secondary | ICD-10-CM

## 2015-08-05 DIAGNOSIS — Z3A32 32 weeks gestation of pregnancy: Secondary | ICD-10-CM | POA: Diagnosis not present

## 2015-08-05 DIAGNOSIS — O09293 Supervision of pregnancy with other poor reproductive or obstetric history, third trimester: Secondary | ICD-10-CM | POA: Diagnosis not present

## 2015-08-06 ENCOUNTER — Ambulatory Visit: Payer: Medicaid Other

## 2015-08-11 ENCOUNTER — Ambulatory Visit: Payer: Medicaid Other

## 2015-08-12 ENCOUNTER — Other Ambulatory Visit: Payer: Self-pay | Admitting: Family

## 2015-08-12 ENCOUNTER — Encounter (HOSPITAL_COMMUNITY): Payer: Self-pay

## 2015-08-12 ENCOUNTER — Other Ambulatory Visit: Payer: Medicaid Other

## 2015-08-12 ENCOUNTER — Other Ambulatory Visit (HOSPITAL_COMMUNITY): Payer: Self-pay | Admitting: Obstetrics and Gynecology

## 2015-08-12 ENCOUNTER — Ambulatory Visit (HOSPITAL_COMMUNITY)
Admission: RE | Admit: 2015-08-12 | Discharge: 2015-08-12 | Disposition: A | Discharge status: Home / Self Care | Payer: Medicaid Other | Source: Ambulatory Visit | Attending: Family Medicine | Admitting: Family Medicine

## 2015-08-12 DIAGNOSIS — Z3A33 33 weeks gestation of pregnancy: Secondary | ICD-10-CM | POA: Insufficient documentation

## 2015-08-12 DIAGNOSIS — O09293 Supervision of pregnancy with other poor reproductive or obstetric history, third trimester: Secondary | ICD-10-CM | POA: Diagnosis not present

## 2015-08-12 DIAGNOSIS — Z86718 Personal history of other venous thrombosis and embolism: Secondary | ICD-10-CM

## 2015-08-12 DIAGNOSIS — O09529 Supervision of elderly multigravida, unspecified trimester: Secondary | ICD-10-CM

## 2015-08-12 DIAGNOSIS — R7309 Other abnormal glucose: Secondary | ICD-10-CM

## 2015-08-12 DIAGNOSIS — O0993 Supervision of high risk pregnancy, unspecified, third trimester: Secondary | ICD-10-CM

## 2015-08-12 DIAGNOSIS — O09523 Supervision of elderly multigravida, third trimester: Secondary | ICD-10-CM | POA: Diagnosis present

## 2015-08-12 NOTE — Addendum Note (Signed)
Addended by: Aldona LentoFISHER, Tirso Laws L on: 08/12/2015 12:39 PM   Modules accepted: Orders

## 2015-08-12 NOTE — Addendum Note (Signed)
Addended by: Sherre LainASH, AMANDA A on: 08/12/2015 01:04 PM   Modules accepted: Orders

## 2015-08-13 LAB — CARDIOLIPIN ANTIBODIES, IGG, IGM, IGA
Anticardiolipin IgA: <11 [APL'U]
Anticardiolipin IgG: <14 [GPL'U]

## 2015-08-14 ENCOUNTER — Other Ambulatory Visit: Payer: Medicaid Other

## 2015-08-14 ENCOUNTER — Encounter: Payer: Self-pay | Admitting: Obstetrics & Gynecology

## 2015-08-14 ENCOUNTER — Encounter: Payer: Self-pay | Admitting: *Deleted

## 2015-08-14 DIAGNOSIS — R7309 Other abnormal glucose: Secondary | ICD-10-CM

## 2015-08-14 LAB — BETA-2 GLYCOPROTEIN ANTIBODIES
BETA-2-GLYCOPROTEIN I IGM: 19 SMU (ref <=20)
Beta-2-Glycoprotein I IgA: 11 SAU (ref <=20)

## 2015-08-14 LAB — HOMOCYSTEINE: Homocysteine: 5.7 umol/L (ref <10.4)

## 2015-08-14 LAB — GLUCOSE, CAPILLARY: GLUCOSE-CAPILLARY: 233 mg/dL — AB (ref 65–99)

## 2015-08-15 LAB — RFX DRVVT SCR W/RFLX CONF 1:1 MIX: dRVVT Screen: 39 s (ref <=45)

## 2015-08-15 LAB — RFX PTT-LA W/RFX TO HEX PHASE CONF: PTT-LA Screen: 37 s (ref <=40)

## 2015-08-15 LAB — LUPUS ANTICOAGULANT PANEL

## 2015-08-17 ENCOUNTER — Encounter: Payer: Medicaid Other | Attending: Obstetrics and Gynecology | Admitting: Dietician

## 2015-08-17 ENCOUNTER — Ambulatory Visit (INDEPENDENT_AMBULATORY_CARE_PROVIDER_SITE_OTHER): Payer: Medicaid Other | Admitting: Obstetrics and Gynecology

## 2015-08-17 ENCOUNTER — Other Ambulatory Visit: Payer: Self-pay

## 2015-08-17 VITALS — BP 107/48 | HR 86 | Temp 98.0°F | Wt 151.7 lb

## 2015-08-17 DIAGNOSIS — Z029 Encounter for administrative examinations, unspecified: Secondary | ICD-10-CM | POA: Diagnosis present

## 2015-08-17 DIAGNOSIS — O24410 Gestational diabetes mellitus in pregnancy, diet controlled: Secondary | ICD-10-CM | POA: Diagnosis present

## 2015-08-17 DIAGNOSIS — O0990 Supervision of high risk pregnancy, unspecified, unspecified trimester: Secondary | ICD-10-CM

## 2015-08-17 DIAGNOSIS — Z559 Problems related to education and literacy, unspecified: Secondary | ICD-10-CM

## 2015-08-17 DIAGNOSIS — O24419 Gestational diabetes mellitus in pregnancy, unspecified control: Secondary | ICD-10-CM | POA: Insufficient documentation

## 2015-08-17 LAB — POCT URINALYSIS DIP (DEVICE)
BILIRUBIN URINE: NEGATIVE
Glucose, UA: NEGATIVE mg/dL
HGB URINE DIPSTICK: NEGATIVE
KETONES UR: NEGATIVE mg/dL
Leukocytes, UA: NEGATIVE
Nitrite: NEGATIVE
PH: 6 (ref 5.0–8.0)
Protein, ur: NEGATIVE mg/dL
Specific Gravity, Urine: 1.02 (ref 1.005–1.030)
Urobilinogen, UA: 0.2 mg/dL (ref 0.0–1.0)

## 2015-08-17 LAB — PROTHROMBIN GENE MUTATION

## 2015-08-17 LAB — FACTOR 5 LEIDEN

## 2015-08-17 MED ORDER — ACCU-CHEK FASTCLIX LANCETS MISC: Status: DC

## 2015-08-17 MED ORDER — PRENATAL VITAMINS PLUS 27-1 MG PO TABS: 1.0000 | ORAL_TABLET | Freq: Every day | ORAL | Status: DC

## 2015-08-17 MED ORDER — GLUCOSE BLOOD VI STRP: ORAL_STRIP | Status: DC

## 2015-08-17 MED ORDER — ACCU-CHEK AVIVA PLUS W/DEVICE KIT: PACK | Status: DC

## 2015-08-17 NOTE — Progress Notes (Signed)
Used Runner, broadcasting/film/videoVideo Interpreter [phone]. C/o pain lower back .

## 2015-08-17 NOTE — Progress Notes (Signed)
Diabetes Education: 08/17/15 Speaks Nepali only. Used the Scientist, research (life sciences)[employer] Video Interpretive Service.  Even with the system.  The patient had difficulty following the instruction. Attempted to complete a review of GDM and its implications for her health and the health of her baby.   At times she appeared to have problems attending and staying awake.  She reported having worked last night prior to her appointment. Demonstration instructions for the Accu-chek Aviva Plus meter.  She noted that she did not understand at all the things I was trying to teach her. Nursing to electronically order her meter hat her pharmacy on [address]. I will meet her on Wednesday, June 21 following her Echo, to use the meter that she gets from her pharmacy and do a meter instruction. The instructional session lasted 75 minutes and I cannot gage how much she comprehended. Will attempt to work with her as closely as possible. Maggie May, RN, RD, LDN

## 2015-08-17 NOTE — Progress Notes (Signed)
Subjective:  Sierra Blanchard is a 40 y.o. G6P5005 at 2919w6d being seen today for ongoing prenatal care.  She is currently monitored for the following issues for this high-risk pregnancy and has Asthma in adult; Language barrier, speaks Nepali only; Anemia, postpartum; Supervision of high risk pregnancy, antepartum; AMA (advanced maternal age) multigravida 35+; History of DVT (deep vein thrombosis); and Problems related to education and literacy, unspecified on her problem list.  Patient reports no complaints.  Contractions: Not present. Vag. Bleeding: None.  Movement: Present. Denies leaking of fluid.   The following portions of the patient's history were reviewed and updated as appropriate: allergies, current medications, past family history, past medical history, past social history, past surgical history and problem list. Problem list updated.  Objective:   Filed Vitals:   08/17/15 0757  BP: 107/48  Pulse: 86  Temp: 98 F (36.7 C)  Weight: 151 lb 11.2 oz (68.811 kg)    Fetal Status: Fetal Heart Rate (bpm): 130   Movement: Present     General:  Alert, oriented and cooperative. Patient is in no acute distress.  Skin: Skin is warm and dry. No rash noted.   Cardiovascular: Normal heart rate noted  Respiratory: Normal respiratory effort, no problems with respiration noted  Abdomen: Soft, gravid, appropriate for gestational age. Pain/Pressure: Present     Pelvic: Cervical exam deferred        Extremities: Normal range of motion.  Edema: Trace  Mental Status: Normal mood and affect. Normal behavior. Normal judgment and thought content.   Urinalysis:      Assessment and Plan:  Pregnancy: [id number] at 6219w6d  # GDM -  Making dx today. 1 hr 138. FINALLY presented last week for 3-hour but trouble getting sample, random glucose 233. Our lab says an md reviewed this and decided to call gdm and have pt see dm educator, but that hasn't happened yet. i agree with proceeding w/ gdm dx and will have see  dm educator today and f/u with her next week to review glucose. Will also schedule to f/u this Thursday to review log as I suspect will need to start med and antenatal testing and time is of the essence.  # hx dvt in pregnancy - continuing lovenox, bulk of thrombophilia w/u pending (has been drawn)  # AMA - u/s scheduled 6/21  Preterm labor symptoms and general obstetric precautions including but not limited to vaginal bleeding, contractions, leaking of fluid and fetal movement were reviewed in detail with the patient. Please refer to After Visit Summary for other counseling recommendations.    Kathrynn RunningNoah Bedford Jaidon Sponsel, MD

## 2015-08-17 NOTE — Addendum Note (Signed)
Addended by: Kathee DeltonHILLMAN, Unity Luepke L on: 08/17/2015 03:31 PM   Modules accepted: Orders

## 2015-08-17 NOTE — Progress Notes (Signed)
Nutrition note: GDM diet education Pt was recently diagnosed with GDM. Pt has gained 11.7# @ 3821w6d, which is slightly < expected. Pt reports eating 3 meals & 1-2 snacks/d. Pt reports no PNV. Pt reports no N&V but has some heartburn. Pt reports no walking or physical activity. Pt received verbal education via interpreter on i-pad in Nepali on GDM diet. Encouraged ~30 mins of walking/d. Encouraged PNV daily. Discussed wt gain goals of 15-25# or 0.6#/wk. Pt agrees to follow GDM diet with 3 meals & 1-3 snacks/d with proper CHO/ protein combination. Pt has WIC & is not planning on BF. F/u in 1-2 wks Blondell RevealLaura Barby Colvard, MS, RD, LDN, [facility]

## 2015-08-17 NOTE — Progress Notes (Signed)
Late entry: Lab unable to obtain blood for 3 hr gtt. Per Dr Debroah LoopArnold, obtained fingerstick, blood glucose was 233. Reported this to Dr Debroah LoopArnold who said to inform her that she is diabetic and needs to see diabetes educator and [facility]. Informed patient of this and let front office know to schedule patient to see Maggie.

## 2015-08-17 NOTE — Addendum Note (Signed)
Addended by: Gerome ApleyZEYFANG, Kapena Hamme L on: 08/17/2015 09:55 AM   Modules accepted: Orders

## 2015-08-18 ENCOUNTER — Encounter: Payer: Medicaid Other | Admitting: Family Medicine

## 2015-08-19 ENCOUNTER — Encounter (HOSPITAL_COMMUNITY): Payer: Self-pay

## 2015-08-19 ENCOUNTER — Ambulatory Visit (HOSPITAL_COMMUNITY)
Admission: RE | Admit: 2015-08-19 | Discharge: 2015-08-19 | Disposition: A | Discharge status: Home / Self Care | Payer: Medicaid Other | Source: Ambulatory Visit | Attending: Family Medicine | Admitting: Family Medicine

## 2015-08-19 DIAGNOSIS — O09523 Supervision of elderly multigravida, third trimester: Secondary | ICD-10-CM | POA: Insufficient documentation

## 2015-08-19 DIAGNOSIS — O09529 Supervision of elderly multigravida, unspecified trimester: Secondary | ICD-10-CM

## 2015-08-19 DIAGNOSIS — Z3A34 34 weeks gestation of pregnancy: Secondary | ICD-10-CM | POA: Diagnosis not present

## 2015-08-19 DIAGNOSIS — O09293 Supervision of pregnancy with other poor reproductive or obstetric history, third trimester: Secondary | ICD-10-CM | POA: Insufficient documentation

## 2015-08-19 LAB — PROTEIN C, TOTAL: Protein C Antigen: 101 % (ref 70–140)

## 2015-08-19 LAB — PROTEIN C ACTIVITY: Protein C Activity: 88 % (ref 70–180)

## 2015-08-19 LAB — PROTEIN S, TOTAL: PROTEIN S ANTIGEN, TOTAL: 67 % — AB (ref 70–140)

## 2015-08-19 LAB — PROTEIN S ACTIVITY: Protein S Activity: 23 % — ABNORMAL LOW (ref 60–140)

## 2015-08-19 LAB — ANTITHROMBIN III: AntiThromb III Func: 121 % activity — ABNORMAL HIGH (ref 80–120)

## 2015-08-20 ENCOUNTER — Encounter: Payer: Self-pay | Admitting: Obstetrics and Gynecology

## 2015-08-20 DIAGNOSIS — D6859 Other primary thrombophilia: Secondary | ICD-10-CM | POA: Insufficient documentation

## 2015-08-24 ENCOUNTER — Encounter: Payer: Medicaid Other | Admitting: Obstetrics and Gynecology

## 2015-08-26 ENCOUNTER — Ambulatory Visit (HOSPITAL_COMMUNITY): Payer: Medicaid Other

## 2015-08-31 ENCOUNTER — Encounter: Payer: Medicaid Other | Admitting: Obstetrics & Gynecology

## 2015-08-31 ENCOUNTER — Ambulatory Visit (INDEPENDENT_AMBULATORY_CARE_PROVIDER_SITE_OTHER): Payer: Medicaid Other | Admitting: Obstetrics & Gynecology

## 2015-08-31 ENCOUNTER — Encounter: Payer: Self-pay | Admitting: Obstetrics & Gynecology

## 2015-08-31 ENCOUNTER — Other Ambulatory Visit (HOSPITAL_COMMUNITY)
Admission: RE | Admit: 2015-08-31 | Discharge: 2015-08-31 | Disposition: A | Discharge status: Home / Self Care | Payer: Medicaid Other | Source: Ambulatory Visit | Attending: Obstetrics & Gynecology | Admitting: Obstetrics & Gynecology

## 2015-08-31 ENCOUNTER — Encounter: Payer: Self-pay | Admitting: Family Medicine

## 2015-08-31 VITALS — BP 126/51 | HR 88 | Wt 149.8 lb

## 2015-08-31 DIAGNOSIS — R309 Painful micturition, unspecified: Secondary | ICD-10-CM | POA: Diagnosis not present

## 2015-08-31 DIAGNOSIS — O09523 Supervision of elderly multigravida, third trimester: Secondary | ICD-10-CM

## 2015-08-31 DIAGNOSIS — O24410 Gestational diabetes mellitus in pregnancy, diet controlled: Secondary | ICD-10-CM | POA: Diagnosis not present

## 2015-08-31 DIAGNOSIS — Z113 Encounter for screening for infections with a predominantly sexual mode of transmission: Secondary | ICD-10-CM | POA: Diagnosis not present

## 2015-08-31 DIAGNOSIS — O09529 Supervision of elderly multigravida, unspecified trimester: Secondary | ICD-10-CM

## 2015-08-31 DIAGNOSIS — O0993 Supervision of high risk pregnancy, unspecified, third trimester: Secondary | ICD-10-CM

## 2015-08-31 DIAGNOSIS — Z86718 Personal history of other venous thrombosis and embolism: Secondary | ICD-10-CM | POA: Diagnosis not present

## 2015-08-31 LAB — POCT URINALYSIS DIP (DEVICE)
BILIRUBIN URINE: NEGATIVE
Glucose, UA: NEGATIVE mg/dL
HGB URINE DIPSTICK: NEGATIVE
KETONES UR: NEGATIVE mg/dL
Nitrite: NEGATIVE
PH: 6 (ref 5.0–8.0)
Protein, ur: NEGATIVE mg/dL
Specific Gravity, Urine: 1.025 (ref 1.005–1.030)
Urobilinogen, UA: 0.2 mg/dL (ref 0.0–1.0)

## 2015-08-31 LAB — OB RESULTS CONSOLE GBS: GBS: NEGATIVE

## 2015-08-31 NOTE — Progress Notes (Signed)
Used video interpreter Kamana [id number].c/o urinating often, and c/o burning with urination. States is supposed to see diabetic educator. Per chart review did see diabetic educator 08/17/15 but was having difficulty understanding and was rescheduled for 08/19/15 but did not attend that appt. Explained to her diabetes educator not here today will reschedule on Monday, when diabetic educator is here.

## 2015-08-31 NOTE — Progress Notes (Signed)
Subjective: has not started BG testing  Sierra Blanchard is a 40 y.o. G6P5005 at 3662w6d being seen today for ongoing prenatal care.  She is currently monitored for the following issues for this high-risk pregnancy and has Asthma in adult; Language barrier, speaks Nepali only; Anemia, postpartum; Supervision of high risk pregnancy, antepartum; AMA (advanced maternal age) multigravida 35+; History of DVT (deep vein thrombosis); Problems related to education and literacy, unspecified; Gestational diabetes; and Protein S deficiency (HCC) on her problem list.  Patient reports no complaints.  Contractions: Irregular. Vag. Bleeding: None.  Movement: Present. Denies leaking of fluid.   The following portions of the patient's history were reviewed and updated as appropriate: allergies, current medications, past family history, past medical history, past social history, past surgical history and problem list. Problem list updated.  Objective:   Filed Vitals:   08/31/15 1554  BP: 126/51  Pulse: 88  Weight: 149 lb 12.8 oz (67.949 kg)    Fetal Status: Fetal Heart Rate (bpm): 144   Movement: Present     General:  Alert, oriented and cooperative. Patient is in no acute distress.  Skin: Skin is warm and dry. No rash noted.   Cardiovascular: Normal heart rate noted  Respiratory: Normal respiratory effort, no problems with respiration noted  Abdomen: Soft, gravid, appropriate for gestational age. Pain/Pressure: Present     Pelvic: Cervical exam performed        Extremities: Normal range of motion.  Edema: Trace  Mental Status: Normal mood and affect. Normal behavior. Normal judgment and thought content.   Urinalysis: Urine Protein: Negative Urine Glucose: Negative  Assessment and Plan:  Pregnancy: G6P5005 at 6162w6d  1. Diet-controlled gestational diabetes mellitus, unspecified trimester Routine testing done - Culture, beta strep (group b only) - GC[facility]Chlamydia probe amp [facility] - Culture,  OB Urine  2. AMA (advanced maternal age) multigravida 35+, unspecified trimester  - Culture, beta strep (group b only) - GC[facility]Chlamydia probe amp [facility] - Culture, OB Urine  3. Pain with urination  - Culture, OB Urine  4. History of DVT (deep vein thrombosis) Lovenox   5. Supervision of high risk pregnancy, antepartum, third trimester Needs appt with DM educator to establish glucose monitoring  Preterm labor symptoms and general obstetric precautions including but not limited to vaginal bleeding, contractions, leaking of fluid and fetal movement were reviewed in detail with the patient. Please refer to After Visit Summary for other counseling recommendations.  Return in about 1 week (around 09/07/2015) for obfu/ diabetic educator.Adam Phenix.   Sierra Mcphie G Jadyn Barge, MD

## 2015-08-31 NOTE — Patient Instructions (Signed)
Gestational Diabetes Mellitus  Gestational diabetes mellitus, often simply referred to as gestational diabetes, is a type of diabetes that some women develop during pregnancy. In gestational diabetes, the pancreas does not make enough insulin (a hormone), the cells are less responsive to the insulin that is made (insulin resistance), or both. Normally, insulin moves sugars from food into the tissue cells. The tissue cells use the sugars for energy. The lack of insulin or the lack of normal response to insulin causes excess sugars to build up in the blood instead of going into the tissue cells. As a result, high blood sugar (hyperglycemia) develops. The effect of high sugar (glucose) levels can cause many problems.   RISK FACTORS  You have an increased chance of developing gestational diabetes if you have a family history of diabetes and also have one or more of the following risk factors:  · A body mass index over 30 (obesity).  · A previous pregnancy with gestational diabetes.  · An older age at the time of pregnancy.  If blood glucose levels are kept in the normal range during pregnancy, women can have a healthy pregnancy. If your blood glucose levels are not well controlled, there may be risks to you, your unborn baby (fetus), your labor and delivery, or your newborn baby.   SYMPTOMS   If symptoms are experienced, they are much like symptoms you would normally expect during pregnancy. The symptoms of gestational diabetes include:   · Increased thirst (polydipsia).  · Increased urination (polyuria).  · Increased urination during the night (nocturia).  · Weight loss. This weight loss may be rapid.  · Frequent, recurring infections.  · Tiredness (fatigue).  · Weakness.  · Vision changes, such as blurred vision.  · Fruity smell to your breath.  · Abdominal pain.  DIAGNOSIS  Diabetes is diagnosed when blood glucose levels are increased. Your blood glucose level may be checked by one or more of the following blood  tests:  · A fasting blood glucose test. You will not be allowed to eat for at least 8 hours before a blood sample is taken.  · A random blood glucose test. Your blood glucose is checked at any time of the day regardless of when you ate.  · An oral glucose tolerance test (OGTT). Your blood glucose is measured after you have not eaten (fasted) for 1-3 hours and then after you drink a glucose-containing beverage. Since the hormones that cause insulin resistance are highest at about 24-28 weeks of a pregnancy, an OGTT is usually performed during that time. If you have risk factors, you may be screened for undiagnosed type 2 diabetes at your first prenatal visit.  TREATMENT   Gestational diabetes should be managed first with diet and exercise. Medicines may be added only if they are needed.  · You will need to take diabetes medicine or insulin daily to keep blood glucose levels in the desired range.  · You will need to match insulin dosing with exercise and healthy food choices.  If you have gestational diabetes, your treatment goal is to maintain the following blood glucose levels:  · Before meals (preprandial): at or below 95 mg/dL.  · After meals (postprandial):    One hour after a meal: at or below 140 mg/dL.    Two hours after a meal: at or below 120 mg/dL.  If you have pre-existing type 1 or type 2 diabetes, your treatment goal is to maintain the following blood glucose levels:  · Before   meals, at bedtime, and overnight: 60-99 mg/dL.  · After meals: peak of 100-129 mg/dL.  HOME CARE INSTRUCTIONS   · Have your hemoglobin A1c level checked twice a year.  · Perform daily blood glucose monitoring as directed by your health care provider. It is common to perform frequent blood glucose monitoring.  · Monitor urine ketones when you are ill and as directed by your health care provider.  · Take your diabetes medicine and insulin as directed by your health care provider to maintain your blood glucose level in the desired  range.  ¨ Never run out of diabetes medicine or insulin. It is needed every day.  ¨ Adjust insulin based on your intake of carbohydrates. Carbohydrates can raise blood glucose levels but need to be included in your diet. Carbohydrates provide vitamins, minerals, and fiber which are an essential part of a healthy diet. Carbohydrates are found in fruits, vegetables, whole grains, dairy products, legumes, and foods containing added sugars.  · Eat healthy foods. Alternate 3 meals with 3 snacks.  · Maintain a healthy weight gain. The usual total expected weight gain varies according to your prepregnancy body mass index (BMI).  · Carry a medical alert card or wear your medical alert jewelry.  · Carry a 15-gram carbohydrate snack with you at all times to treat low blood glucose (hypoglycemia). Some examples of 15-gram carbohydrate snacks include:  ¨ Glucose tablets, 3 or 4.  ¨ Glucose gel, 15-gram tube.  ¨ Raisins, 2 tablespoons (24 g).  ¨ Jelly beans, 6.  ¨ Animal crackers, 8.  ¨ Fruit juice, regular soda, or low-fat milk, 4 ounces (120 mL).  ¨ Gummy treats, 9.  · Recognize hypoglycemia. Hypoglycemia during pregnancy occurs with blood glucose levels of 60 mg/dL and below. The risk for hypoglycemia increases when fasting or skipping meals, during or after intense exercise, and during sleep. Hypoglycemia symptoms can include:  ¨ Tremors or shakes.  ¨ Decreased ability to concentrate.  ¨ Sweating.  ¨ Increased heart rate.  ¨ Headache.  ¨ Dry mouth.  ¨ Hunger.  ¨ Irritability.  ¨ Anxiety.  ¨ Restless sleep.  ¨ Altered speech or coordination.  ¨ Confusion.  · Treat hypoglycemia promptly. If you are alert and able to safely swallow, follow the 15:15 rule:  ¨ Take 15-20 grams of rapid-acting glucose or carbohydrate. Rapid-acting options include glucose gel, glucose tablets, or 4 ounces (120 mL) of fruit juice, regular soda, or low-fat milk.  ¨ Check your blood glucose level 15 minutes after taking the glucose.  ¨ Take 15-20  grams more of glucose if the repeat blood glucose level is still 70 mg/dL or below.  ¨ Eat a meal or snack within 1 hour once blood glucose levels return to normal.  · Be alert to polyuria (excess urination) and polydipsia (excess thirst) which are early signs of hyperglycemia. An early awareness of hyperglycemia allows for prompt treatment. Treat hyperglycemia as directed by your health care provider.  · Engage in at least 30 minutes of physical activity a day or as directed by your health care provider. Ten minutes of physical activity timed 30 minutes after each meal is encouraged to control postprandial blood glucose levels.  · Adjust your insulin dosing and food intake as needed if you start a new exercise or sport.  · Follow your sick-day plan at any time you are unable to eat or drink as usual.  · Avoid tobacco and alcohol use.  · Keep all follow-up visits as directed   by your health care provider.  · Follow the advice of your health care provider regarding your prenatal and post-delivery (postpartum) appointments, meal planning, exercise, medicines, vitamins, blood tests, other medical tests, and physical activities.  · Perform daily skin and foot care. Examine your skin and feet daily for cuts, bruises, redness, nail problems, bleeding, blisters, or sores.  · Brush your teeth and gums at least twice a day and floss at least once a day. Follow up with your dentist regularly.  · Schedule an eye exam during the first trimester of your pregnancy or as directed by your health care provider.  · Share your diabetes management plan with your workplace or school.  · Stay up-to-date with immunizations.  · Learn to manage stress.  · Obtain ongoing diabetes education and support as needed.  · Learn about and consider breastfeeding your baby.  · You should have your blood sugar level checked 6-12 weeks after delivery. This is done with an oral glucose tolerance test (OGTT).  SEEK MEDICAL CARE IF:   · You are unable to  eat food or drink fluids for more than 6 hours.  · You have nausea and vomiting for more than 6 hours.  · You have a blood glucose level of 200 mg/dL and you have ketones in your urine.  · There is a change in mental status.  · You develop vision problems.  · You have a persistent headache.  · You have upper abdominal pain or discomfort.  · You develop an additional serious illness.  · You have diarrhea for more than 6 hours.  · You have been sick or have had a fever for a couple of days and are not getting better.  SEEK IMMEDIATE MEDICAL CARE IF:   · You have difficulty breathing.  · You no longer feel the baby moving.  · You are bleeding or have discharge from your vagina.  · You start having premature contractions or labor.  MAKE SURE YOU:  · Understand these instructions.  · Will watch your condition.  · Will get help right away if you are not doing well or get worse.     This information is not intended to replace advice given to you by your health care provider. Make sure you discuss any questions you have with your health care provider.     Document Released: 05/23/2000 Document Revised: 03/07/2014 Document Reviewed: 09/13/2011  Elsevier Interactive Patient Education ©2016 [employer]

## 2015-09-01 LAB — CULTURE, OB URINE
Colony Count: NO GROWTH
Organism ID, Bacteria: NO GROWTH

## 2015-09-02 LAB — CULTURE, BETA STREP (GROUP B ONLY)

## 2015-09-03 LAB — GC/CHLAMYDIA PROBE AMP (~~LOC~~) NOT AT ARMC
CHLAMYDIA, DNA PROBE: NEGATIVE
NEISSERIA GONORRHEA: NEGATIVE

## 2015-09-07 ENCOUNTER — Encounter: Payer: Self-pay | Admitting: Family Medicine

## 2015-09-07 ENCOUNTER — Ambulatory Visit (INDEPENDENT_AMBULATORY_CARE_PROVIDER_SITE_OTHER): Payer: Medicaid Other | Admitting: Obstetrics & Gynecology

## 2015-09-07 ENCOUNTER — Encounter: Payer: Medicaid Other | Attending: Obstetrics and Gynecology | Admitting: Dietician

## 2015-09-07 VITALS — BP 128/81 | HR 71 | Wt 153.7 lb

## 2015-09-07 DIAGNOSIS — O24419 Gestational diabetes mellitus in pregnancy, unspecified control: Secondary | ICD-10-CM | POA: Insufficient documentation

## 2015-09-07 DIAGNOSIS — Z3A00 Weeks of gestation of pregnancy not specified: Secondary | ICD-10-CM | POA: Insufficient documentation

## 2015-09-07 DIAGNOSIS — O09523 Supervision of elderly multigravida, third trimester: Secondary | ICD-10-CM

## 2015-09-07 DIAGNOSIS — O0993 Supervision of high risk pregnancy, unspecified, third trimester: Secondary | ICD-10-CM

## 2015-09-07 LAB — POCT URINALYSIS DIP (DEVICE)
BILIRUBIN URINE: NEGATIVE
Glucose, UA: NEGATIVE mg/dL
HGB URINE DIPSTICK: NEGATIVE
Ketones, ur: NEGATIVE mg/dL
LEUKOCYTES UA: NEGATIVE
Nitrite: NEGATIVE
PH: 5.5 (ref 5.0–8.0)
Protein, ur: NEGATIVE mg/dL
SPECIFIC GRAVITY, URINE: 1.02 (ref 1.005–1.030)
Urobilinogen, UA: 0.2 mg/dL (ref 0.0–1.0)

## 2015-09-07 MED ORDER — ACCU-CHEK SOFTCLIX LANCET DEV MISC: Status: DC

## 2015-09-07 NOTE — Progress Notes (Signed)
Subjective: urinary frequency and pressure  Sierra Blanchard is a 40 y.o. [id number] at 6033w6d being seen today for ongoing prenatal care.  She is currently monitored for the following issues for this high-risk pregnancy and has Asthma in adult; Language barrier, speaks Nepali only; Anemia, postpartum; Supervision of high risk pregnancy, antepartum; AMA (advanced maternal age) multigravida 35+; History of DVT (deep vein thrombosis); Problems related to education and literacy, unspecified; Gestational diabetes; and Protein S deficiency (HCC) on her problem list.  Patient reports occasional contractions.  Contractions: Irregular. Vag. Bleeding: None.  Movement: Present. Denies leaking of fluid.   The following portions of the patient's history were reviewed and updated as appropriate: allergies, current medications, past family history, past medical history, past social history, past surgical history and problem list. Problem list updated.  Objective:   Filed Vitals:   09/07/15 0932  BP: 128/81  Pulse: 71  Weight: 153 lb 11.2 oz (69.718 kg)    Fetal Status: Fetal Heart Rate (bpm): 143   Movement: Present     General:  Alert, oriented and cooperative. Patient is in no acute distress.  Skin: Skin is warm and dry. No rash noted.   Cardiovascular: Normal heart rate noted  Respiratory: Normal respiratory effort, no problems with respiration noted  Abdomen: Soft, gravid, appropriate for gestational age. Pain/Pressure: Present     Pelvic:  Cervical exam deferred        Extremities: Normal range of motion.  Edema: None  Mental Status: Normal mood and affect. Normal behavior. Normal judgment and thought content.   Urinalysis: Urine Protein: Negative Urine Glucose: Negative  Assessment and Plan:  Pregnancy: G6P5005 at 5333w6d  1. Supervision of high risk pregnancy, antepartum, third trimester Needs DM teaching - US MFM OB FOLLOW UP; Future  Term labor symptoms and general obstetric precautions  including but not limited to vaginal bleeding, contractions, leaking of fluid and fetal movement were reviewed in detail with the patient. Please refer to After Visit Summary for other counseling recommendations.  1 week f/u  Adam PhenixJames G Arnold, MD

## 2015-09-07 NOTE — Patient Instructions (Signed)
Vaginal Delivery °During delivery, your health care provider will help you give birth to your baby. During a vaginal delivery, you will work to push the baby out of your vagina. However, before you can push your baby out, a few things need to happen. The opening of your uterus (cervix) has to soften, thin out, and open up (dilate) all the way to 10 cm. Also, your baby has to move down from the uterus into your vagina.  °SIGNS OF LABOR  °Your health care provider will first need to make sure you are in labor. Signs of labor include:  °· Passing what is called the mucous plug before labor begins. This is a small amount of blood-stained mucus. °· Having regular, painful uterine contractions.   °· The time between contractions gets shorter.   °· The discomfort and pain gradually get more intense. °· Contraction pains get worse when walking and do not go away when resting.   °· Your cervix becomes thinner (effacement) and dilates. °BEFORE THE DELIVERY °Once you are in labor and admitted into the hospital or care center, your health care provider may do the following:  °· Perform a complete physical exam. °· Review any complications related to pregnancy or labor.  °· Check your blood pressure, pulse, temperature, and heart rate (vital signs).   °· Determine if, and when, the rupture of amniotic membranes occurred. °· Do a vaginal exam (using a sterile glove and lubricant) to determine:   °¨ The position (presentation) of the baby. Is the baby's head presenting first (vertex) in the birth canal (vagina), or are the feet or buttocks first (breech)?   °¨ The level (station) of the baby's head within the birth canal.   °¨ The effacement and dilatation of the cervix.   °· An electronic fetal monitor is usually placed on your abdomen when you first arrive. This is used to monitor your contractions and the baby's heart rate. °¨ When the monitor is on your abdomen (external fetal monitor), it can only pick up the frequency and  length of your contractions. It cannot tell the strength of your contractions. °¨ If it becomes necessary for your health care provider to know exactly how strong your contractions are or to see exactly what the baby's heart rate is doing, an internal monitor may be inserted into your vagina and uterus. Your health care provider will discuss the benefits and risks of using an internal monitor and obtain your permission before inserting the device. °¨ Continuous fetal monitoring may be needed if you have an epidural, are receiving certain medicines (such as oxytocin), or have pregnancy or labor complications. °· An IV access tube may be placed into a vein in your arm to deliver fluids and medicines if necessary. °THREE STAGES OF LABOR AND DELIVERY °Normal labor and delivery is divided into three stages. °First Stage °This stage starts when you begin to contract regularly and your cervix begins to efface and dilate. It ends when your cervix is completely open (fully dilated). The first stage is the longest stage of labor and can last from 3 hours to 15 hours.  °Several methods are available to help with labor pain. You and your health care provider will decide which option is best for you. Options include:  °· Opioid medicines. These are strong pain medicines that you can get through your IV tube or as a shot into your muscle. These medicines lessen pain but do not make it go away completely.  °· Epidural. A medicine is given through a thin tube that   is inserted in your back. The medicine numbs the lower part of your body and prevents any pain in that area. °· Paracervical pain medicine. This is an injection of an anesthetic on each side of your cervix.   °· You may request natural childbirth, which does not involve the use of pain medicines or an epidural during labor and delivery. Instead, you will use other things, such as breathing exercises, to help cope with the pain. °Second Stage °The second stage of labor  begins when your cervix is fully dilated at 10 cm. It continues until you push your baby down through the birth canal and the baby is born. This stage can take only minutes or several hours. °· The location of your baby's head as it moves through the birth canal is reported as a number called a station. If the baby's head has not started its descent, the station is described as being at minus 3 (-3). When your baby's head is at the zero station, it is at the middle of the birth canal and is engaged in the pelvis. The station of your baby helps indicate the progress of the second stage of labor. °· When your baby is born, your health care provider may hold the baby with his or her head lowered to prevent amniotic fluid, mucus, and blood from getting into the baby's lungs. The baby's mouth and nose may be suctioned with a small bulb syringe to remove any additional fluid. °· Your health care provider may then place the baby on your stomach. It is important to keep the baby from getting cold. To do this, the health care provider will dry the baby off, place the baby directly on your skin (with no blankets between you and the baby), and cover the baby with warm, dry blankets.   °· The umbilical cord is cut. °Third Stage °During the third stage of labor, your health care provider will deliver the placenta (afterbirth) and make sure your bleeding is under control. The delivery of the placenta usually takes about 5 minutes but can take up to 30 minutes. After the placenta is delivered, a medicine may be given either by IV or injection to help contract the uterus and control bleeding. If you are planning to breastfeed, you can try to do so now. °After you deliver the placenta, your uterus should contract and get very firm. If your uterus does not remain firm, your health care provider will massage it. This is important because the contraction of the uterus helps cut off bleeding at the site where the placenta was attached  to your uterus. If your uterus does not contract properly and stay firm, you may continue to bleed heavily. If there is a lot of bleeding, medicines may be given to contract the uterus and stop the bleeding.  °  °This information is not intended to replace advice given to you by your health care provider. Make sure you discuss any questions you have with your health care provider. °  °Document Released: 11/24/2007 Document Revised: 03/07/2014 Document Reviewed: 10/12/2011 °Elsevier Interactive Patient Education ©2016 Elsevier Inc. ° °

## 2015-09-07 NOTE — Progress Notes (Signed)
Nepali video interpreter used Patient would like a note today for her employer with details of her appointment

## 2015-09-07 NOTE — Progress Notes (Signed)
Patient ID: Shirely Toren, female   DOB: 13-Apr-1975, 40 y.o.   MRN: [id number]  Georgean is seen for the second time.  She speaks no [place] and appears to understand only the basic request.  Encounter was assisted by the [place] interpreter using the video Stratus. Came today with her supplies from the [facility].  None of the bags had been opened.  She has not been taking her pre-natal vitamins.   The pharmacy did not provide her with the correct Medicaid Accu-chek Aviva Plus meter kit.  They provide 2 canisters of the correct Aviva Plus strips.  They provided the wrong lancets. We reviewed with the glucose testing process.  She tried to follow the process.  Provided her a note to take to the pharmacy for getting the prescription filled with a kit and the correct lancets.  Ask her to bring these to her next appointment and  I will try to be here to review their use at that time. Provided the handout Nutrition, Diabetes and Pregnancy in [place].  He 54 year old daughter reads and speaks [place].  I ask that she seek the assistance of her daughter in the monitoring process. Today at 10:30 her blood sugar fasting was 83 mg/dl. Maggie May, RN, RD LDN

## 2015-09-16 ENCOUNTER — Ambulatory Visit (INDEPENDENT_AMBULATORY_CARE_PROVIDER_SITE_OTHER): Payer: Medicaid Other | Admitting: Advanced Practice Midwife

## 2015-09-16 ENCOUNTER — Encounter: Payer: Self-pay | Admitting: *Deleted

## 2015-09-16 VITALS — BP 131/77 | HR 71 | Wt 150.0 lb

## 2015-09-16 DIAGNOSIS — O24410 Gestational diabetes mellitus in pregnancy, diet controlled: Secondary | ICD-10-CM

## 2015-09-16 DIAGNOSIS — O163 Unspecified maternal hypertension, third trimester: Secondary | ICD-10-CM

## 2015-09-16 DIAGNOSIS — O09523 Supervision of elderly multigravida, third trimester: Secondary | ICD-10-CM

## 2015-09-16 DIAGNOSIS — Z86718 Personal history of other venous thrombosis and embolism: Secondary | ICD-10-CM

## 2015-09-16 DIAGNOSIS — O09529 Supervision of elderly multigravida, unspecified trimester: Secondary | ICD-10-CM

## 2015-09-16 LAB — CBC
HEMATOCRIT: 36 % (ref 35.0–45.0)
HEMOGLOBIN: 11.9 g/dL (ref 11.7–15.5)
MCH: 31.1 pg (ref 27.0–33.0)
MCHC: 33.1 g/dL (ref 32.0–36.0)
MCV: 94 fL (ref 80.0–100.0)
MPV: 10.8 fL (ref 7.5–12.5)
Platelets: 222 10*3/uL (ref 140–400)
RBC: 3.83 MIL/uL (ref 3.80–5.10)
RDW: 12.5 % (ref 11.0–15.0)
WBC: 10.9 10*3/uL — AB (ref 3.8–10.8)

## 2015-09-16 LAB — COMPREHENSIVE METABOLIC PANEL
ALBUMIN: 3.1 g/dL — AB (ref 3.6–5.1)
ALK PHOS: 144 U/L — AB (ref 33–115)
ALT: 13 U/L (ref 6–29)
AST: 20 U/L (ref 10–30)
BILIRUBIN TOTAL: 0.9 mg/dL (ref 0.2–1.2)
BUN: 11 mg/dL (ref 7–25)
CALCIUM: 9 mg/dL (ref 8.6–10.2)
CO2: 19 mmol/L — AB (ref 20–31)
CREATININE: 0.47 mg/dL — AB (ref 0.50–1.10)
Chloride: 104 mmol/L (ref 98–110)
Glucose, Bld: 69 mg/dL (ref 65–99)
Potassium: 4.6 mmol/L (ref 3.5–5.3)
SODIUM: 135 mmol/L (ref 135–146)
TOTAL PROTEIN: 6.6 g/dL (ref 6.1–8.1)

## 2015-09-16 LAB — POCT URINALYSIS DIP (DEVICE)
BILIRUBIN URINE: NEGATIVE
GLUCOSE, UA: NEGATIVE mg/dL
Hgb urine dipstick: NEGATIVE
KETONES UR: NEGATIVE mg/dL
LEUKOCYTES UA: NEGATIVE
NITRITE: NEGATIVE
PH: 6 (ref 5.0–8.0)
Protein, ur: NEGATIVE mg/dL
Specific Gravity, Urine: 1.02 (ref 1.005–1.030)
Urobilinogen, UA: 0.2 mg/dL (ref 0.0–1.0)

## 2015-09-16 LAB — GLUCOSE, CAPILLARY: Glucose-Capillary: 88 mg/dL (ref 65–99)

## 2015-09-16 NOTE — Patient Instructions (Signed)

## 2015-09-16 NOTE — Progress Notes (Signed)
Interpreter # [phone] used to complete visit.  CBG done today @ 1100 = 88.  Pt has extreme difficulty understanding the procedure for self checking of blood sugar. She was not given the correct lancets and device from her pharmacy. Due to this issue, she will not need to check CBG's over the next week. Pt was advised to continue to follow the diabetic diet as previously explained. She is scheduled for growth US /BPP on Friday @ 1430.  Pt advised that she will have IOL on 7/27 @ midnight.  She will take her last Lovenox injection on 7/25 @ 1730. Pt voiced understanding of all information and instructions. Total time spent with pt by this nurse was 2 hours.

## 2015-09-16 NOTE — Progress Notes (Signed)
Video interpreter [phone] Patient reports feeling "out of breath" with eating  Patient states she has not been checking her blood sugars as she doesn't know how to do it- states she brought everything with her today (Diane to help patient)

## 2015-09-17 ENCOUNTER — Telehealth (HOSPITAL_COMMUNITY): Payer: Self-pay | Admitting: *Deleted

## 2015-09-17 ENCOUNTER — Encounter (HOSPITAL_COMMUNITY): Payer: Self-pay | Admitting: *Deleted

## 2015-09-17 LAB — HEMOGLOBIN A1C
HEMOGLOBIN A1C: 5.8 % — AB (ref <5.7)
Mean Plasma Glucose: 120 mg/dL

## 2015-09-17 NOTE — Progress Notes (Signed)
Subjective:  Sierra Blanchard is a 40 y.o. G6P5005 at 74w2dbeing seen today for ongoing prenatal care.  She is currently monitored for the following issues for this high-risk pregnancy and has Asthma in adult; Language barrier, speaks Nepali only; Anemia, postpartum; Supervision of high risk pregnancy, antepartum; AMA (advanced maternal age) multigravida 349+ History of DVT (deep vein thrombosis); Problems related to education and literacy, unspecified; Gestational diabetes; and Protein S deficiency [place] problem [place].  Patient reports pelvic pressure. Does not think the baby is coming. Denies headache, vision changes, epigastric pain. Contractions: Irregular. Vag. Bleeding: None.  Movement: Present. Denies leaking of fluid.   The following portions of the patient's history were reviewed and updated as appropriate: allergies, current medications, past family history, past medical history, past social history, past surgical history and problem list. Problem list updated.  Objective:   Filed Vitals:   09/16/15 0921 09/16/15 1225  BP: 139/93 131/77  Pulse: 71   Weight: 150 lb (68.04 kg)     Fetal Status: Fetal Heart Rate (bpm): 126 Fundal Height: 39 cm Movement: Present  Presentation: Vertex  General:  Alert, oriented and cooperative. Patient is in no acute distress.  Skin: Skin is warm and dry. No rash noted.   Cardiovascular: Normal heart rate noted  Respiratory: Normal respiratory effort, no problems with respiration noted  Abdomen: Soft, gravid, appropriate for gestational age. Pain/Pressure: Present     Pelvic:  Cervical exam attempted. Unable to determine exact Dilation due to patient intolerance and lack of understanding of exam, but a behind the pelvis and no obvious cervical dilation.        Extremities: Normal range of motion.  Edema: Trace  Mental Status: Normal mood and affect. Normal behavior. Normal judgment and thought content.   Urinalysis: Urine Protein: Negative Urine  Glucose: Negative  CBG: 89  Assessment and Plan:  Pregnancy: G6P5005 at 361w2d1. Diet controlled gestational diabetes mellitus in third trimester  - POCT Glucose (CBG) - Hemoglobin A1c - [place] FETAL BPP WO NON STRESS; Future  2. AMA (advanced maternal age) multigravida 3567+unspecified trimester  - Fetal nonstress test - [place] FETAL BPP WO NON STRESS; Future  3. Hypertension complicating pregnancy, third trimester  - CBC - Comp Met (CMET) - Protein / creatinine ratio, urine - [place] FETAL BPP WO NON STRESS; Future  4. History of DVT (deep vein thrombosis)  Term labor symptoms and general obstetric precautions including but not limited to vaginal bleeding, contractions, leaking of fluid and fetal movement were reviewed in detail with the patient. Please refer to After Visit Summary for other counseling recommendations.  Diane Day RN spent a very long time discussing blood sugar testing with the pt via video interpreter. Doubt pt understands. Will do Hgb A1C today, growth [place] check CBG at [place] and monitor labor progress closely due to unknown blood sugars. Discussed with Dr. ArRoselie Awkwardho agrees with plan of care. Return in about 6 weeks (around 10/28/2015) for PP visit.  IOL on 7/27.  ViManya SilvasCNM

## 2015-09-17 NOTE — Telephone Encounter (Signed)
Preadmission screen [id number] interpreter

## 2015-09-18 ENCOUNTER — Ambulatory Visit (HOSPITAL_COMMUNITY)
Admission: RE | Admit: 2015-09-18 | Discharge: 2015-09-18 | Disposition: A | Discharge status: Home / Self Care | Payer: Medicaid Other | Source: Ambulatory Visit | Attending: Obstetrics & Gynecology | Admitting: Obstetrics & Gynecology

## 2015-09-18 ENCOUNTER — Encounter (HOSPITAL_COMMUNITY): Payer: Self-pay

## 2015-09-18 DIAGNOSIS — O09293 Supervision of pregnancy with other poor reproductive or obstetric history, third trimester: Secondary | ICD-10-CM | POA: Diagnosis not present

## 2015-09-18 DIAGNOSIS — Z3A38 38 weeks gestation of pregnancy: Secondary | ICD-10-CM | POA: Diagnosis not present

## 2015-09-18 DIAGNOSIS — O163 Unspecified maternal hypertension, third trimester: Secondary | ICD-10-CM

## 2015-09-18 DIAGNOSIS — O0993 Supervision of high risk pregnancy, unspecified, third trimester: Secondary | ICD-10-CM

## 2015-09-18 DIAGNOSIS — O09529 Supervision of elderly multigravida, unspecified trimester: Secondary | ICD-10-CM

## 2015-09-18 DIAGNOSIS — O09523 Supervision of elderly multigravida, third trimester: Secondary | ICD-10-CM | POA: Diagnosis present

## 2015-09-18 DIAGNOSIS — O24410 Gestational diabetes mellitus in pregnancy, diet controlled: Secondary | ICD-10-CM | POA: Diagnosis not present

## 2015-09-22 ENCOUNTER — Ambulatory Visit (HOSPITAL_COMMUNITY): Payer: Medicaid Other

## 2015-09-23 ENCOUNTER — Encounter (HOSPITAL_COMMUNITY): Payer: Self-pay | Admitting: *Deleted

## 2015-09-23 ENCOUNTER — Inpatient Hospital Stay (HOSPITAL_COMMUNITY)
Admission: AD | Admit: 2015-09-23 | Discharge: 2015-09-25 | DRG: 775 | Disposition: A | Discharge status: Home / Self Care | Payer: Medicaid Other | Source: Ambulatory Visit | Attending: Obstetrics and Gynecology | Admitting: Obstetrics and Gynecology

## 2015-09-23 DIAGNOSIS — Z825 Family history of asthma and other chronic lower respiratory diseases: Secondary | ICD-10-CM

## 2015-09-23 DIAGNOSIS — IMO0001 Reserved for inherently not codable concepts without codable children: Secondary | ICD-10-CM

## 2015-09-23 DIAGNOSIS — D6859 Other primary thrombophilia: Secondary | ICD-10-CM | POA: Diagnosis present

## 2015-09-23 DIAGNOSIS — Z7901 Long term (current) use of anticoagulants: Secondary | ICD-10-CM

## 2015-09-23 DIAGNOSIS — O4202 Full-term premature rupture of membranes, onset of labor within 24 hours of rupture: Principal | ICD-10-CM | POA: Diagnosis present

## 2015-09-23 DIAGNOSIS — O9952 Diseases of the respiratory system complicating childbirth: Secondary | ICD-10-CM | POA: Diagnosis present

## 2015-09-23 DIAGNOSIS — J45909 Unspecified asthma, uncomplicated: Secondary | ICD-10-CM | POA: Diagnosis present

## 2015-09-23 DIAGNOSIS — Z3A39 39 weeks gestation of pregnancy: Secondary | ICD-10-CM

## 2015-09-23 DIAGNOSIS — Z86718 Personal history of other venous thrombosis and embolism: Secondary | ICD-10-CM | POA: Diagnosis not present

## 2015-09-23 DIAGNOSIS — O09523 Supervision of elderly multigravida, third trimester: Secondary | ICD-10-CM | POA: Diagnosis not present

## 2015-09-23 DIAGNOSIS — O9912 Other diseases of the blood and blood-forming organs and certain disorders involving the immune mechanism complicating childbirth: Secondary | ICD-10-CM | POA: Diagnosis not present

## 2015-09-23 DIAGNOSIS — D688 Other specified coagulation defects: Secondary | ICD-10-CM

## 2015-09-23 DIAGNOSIS — O24420 Gestational diabetes mellitus in childbirth, diet controlled: Secondary | ICD-10-CM | POA: Diagnosis present

## 2015-09-23 DIAGNOSIS — O24424 Gestational diabetes mellitus in childbirth, insulin controlled: Secondary | ICD-10-CM | POA: Diagnosis not present

## 2015-09-23 LAB — TYPE AND SCREEN
ABO/RH(D): B POS
ANTIBODY SCREEN: NEGATIVE

## 2015-09-23 LAB — GLUCOSE, CAPILLARY
GLUCOSE-CAPILLARY: 112 mg/dL — AB (ref 65–99)
Glucose-Capillary: 133 mg/dL — ABNORMAL HIGH (ref 65–99)
Glucose-Capillary: 144 mg/dL — ABNORMAL HIGH (ref 65–99)
Glucose-Capillary: 87 mg/dL (ref 65–99)
Glucose-Capillary: 98 mg/dL (ref 65–99)

## 2015-09-23 LAB — CBC
HEMATOCRIT: 33.8 % — AB (ref 36.0–46.0)
Hemoglobin: 11.4 g/dL — ABNORMAL LOW (ref 12.0–15.0)
MCH: 31.1 pg (ref 26.0–34.0)
MCHC: 33.7 g/dL (ref 30.0–36.0)
MCV: 92.3 fL (ref 78.0–100.0)
PLATELETS: 235 10*3/uL (ref 150–400)
RBC: 3.66 MIL/uL — ABNORMAL LOW (ref 3.87–5.11)
RDW: 12.6 % (ref 11.5–15.5)
WBC: 14.4 10*3/uL — ABNORMAL HIGH (ref 4.0–10.5)

## 2015-09-23 MED ORDER — OXYTOCIN BOLUS FROM INFUSION
500.0000 mL | Freq: Once | INTRAVENOUS | Status: AC
  Administered 2015-09-23: 500 mL via INTRAVENOUS

## 2015-09-23 MED ORDER — ONDANSETRON HCL 4 MG/2ML IJ SOLN: 4.0000 mg | Freq: Four times a day (QID) | INTRAMUSCULAR | Status: DC | PRN

## 2015-09-23 MED ORDER — ONDANSETRON HCL 4 MG/2ML IJ SOLN: 4.0000 mg | INTRAMUSCULAR | Status: DC | PRN

## 2015-09-23 MED ORDER — OXYTOCIN BOLUS FROM INFUSION: 500.0000 mL | Freq: Once | INTRAVENOUS | Status: DC

## 2015-09-23 MED ORDER — SODIUM CHLORIDE 0.9 % IV SOLN: INTRAVENOUS | Status: DC

## 2015-09-23 MED ORDER — PRENATAL MULTIVITAMIN CH
1.0000 | ORAL_TABLET | Freq: Every day | ORAL | Status: DC
  Administered 2015-09-24 – 2015-09-25 (×2): 1 via ORAL
  Filled 2015-09-23 (×2): qty 1

## 2015-09-23 MED ORDER — LACTATED RINGERS IV SOLN: 500.0000 mL | INTRAVENOUS | Status: DC | PRN

## 2015-09-23 MED ORDER — DIPHENHYDRAMINE HCL 50 MG/ML IJ SOLN: 12.5000 mg | INTRAMUSCULAR | Status: DC | PRN

## 2015-09-23 MED ORDER — SENNOSIDES-DOCUSATE SODIUM 8.6-50 MG PO TABS
2.0000 | ORAL_TABLET | ORAL | Status: DC
  Administered 2015-09-24: 2 via ORAL
  Filled 2015-09-23: qty 2

## 2015-09-23 MED ORDER — ACETAMINOPHEN 325 MG PO TABS: 650.0000 mg | ORAL_TABLET | ORAL | Status: DC | PRN

## 2015-09-23 MED ORDER — PHENYLEPHRINE 40 MCG/ML (10ML) SYRINGE FOR IV PUSH (FOR BLOOD PRESSURE SUPPORT)
80.0000 ug | PREFILLED_SYRINGE | INTRAVENOUS | Status: DC | PRN
  Filled 2015-09-23: qty 5

## 2015-09-23 MED ORDER — OXYTOCIN 40 UNITS IN LACTATED RINGERS INFUSION - SIMPLE MED
2.5000 [IU]/h | INTRAVENOUS | Status: DC
Start: 2015-09-23 — End: 2015-09-23

## 2015-09-23 MED ORDER — OXYCODONE-ACETAMINOPHEN 5-325 MG PO TABS
2.0000 | ORAL_TABLET | ORAL | Status: DC | PRN
Start: 2015-09-23 — End: 2015-09-23

## 2015-09-23 MED ORDER — DIBUCAINE 1 % RE OINT: 1.0000 "application " | TOPICAL_OINTMENT | RECTAL | Status: DC | PRN

## 2015-09-23 MED ORDER — ENOXAPARIN SODIUM 80 MG/0.8ML ~~LOC~~ SOLN
1.0000 mg/kg | SUBCUTANEOUS | Status: DC
  Administered 2015-09-24 – 2015-09-25 (×2): 70 mg via SUBCUTANEOUS
  Filled 2015-09-23 (×3): qty 0.8

## 2015-09-23 MED ORDER — LACTATED RINGERS IV SOLN
INTRAVENOUS | Status: DC
  Administered 2015-09-23: 19:00:00 via INTRAVENOUS

## 2015-09-23 MED ORDER — WITCH HAZEL-GLYCERIN EX PADS: 1.0000 "application " | MEDICATED_PAD | CUTANEOUS | Status: DC | PRN

## 2015-09-23 MED ORDER — LIDOCAINE HCL (PF) 1 % IJ SOLN: 30.0000 mL | INTRAMUSCULAR | Status: DC | PRN

## 2015-09-23 MED ORDER — OXYCODONE-ACETAMINOPHEN 5-325 MG PO TABS: 1.0000 | ORAL_TABLET | ORAL | Status: DC | PRN

## 2015-09-23 MED ORDER — LIDOCAINE HCL (PF) 1 % IJ SOLN
30.0000 mL | INTRAMUSCULAR | Status: DC | PRN
  Filled 2015-09-23: qty 30

## 2015-09-23 MED ORDER — FENTANYL CITRATE (PF) 100 MCG/2ML IJ SOLN
100.0000 ug | INTRAMUSCULAR | Status: DC | PRN
  Administered 2015-09-23: 100 ug via INTRAVENOUS
  Filled 2015-09-23: qty 2

## 2015-09-23 MED ORDER — ONDANSETRON HCL 4 MG PO TABS: 4.0000 mg | ORAL_TABLET | ORAL | Status: DC | PRN

## 2015-09-23 MED ORDER — SIMETHICONE 80 MG PO CHEW: 80.0000 mg | CHEWABLE_TABLET | ORAL | Status: DC | PRN

## 2015-09-23 MED ORDER — EPHEDRINE 5 MG/ML INJ
10.0000 mg | INTRAVENOUS | Status: DC | PRN
  Filled 2015-09-23: qty 4

## 2015-09-23 MED ORDER — OXYCODONE-ACETAMINOPHEN 5-325 MG PO TABS: 2.0000 | ORAL_TABLET | ORAL | Status: DC | PRN

## 2015-09-23 MED ORDER — TERBUTALINE SULFATE 1 MG/ML IJ SOLN
0.2500 mg | Freq: Once | INTRAMUSCULAR | Status: DC | PRN
  Filled 2015-09-23: qty 1

## 2015-09-23 MED ORDER — IBUPROFEN 600 MG PO TABS
600.0000 mg | ORAL_TABLET | Freq: Four times a day (QID) | ORAL | Status: DC
  Administered 2015-09-23 – 2015-09-25 (×7): 600 mg via ORAL
  Filled 2015-09-23 (×7): qty 1

## 2015-09-23 MED ORDER — COCONUT OIL OIL: 1.0000 "application " | TOPICAL_OIL | Status: DC | PRN

## 2015-09-23 MED ORDER — MISOPROSTOL 200 MCG PO TABS
ORAL_TABLET | ORAL | Status: AC
  Filled 2015-09-23: qty 4

## 2015-09-23 MED ORDER — FENTANYL 2.5 MCG/ML BUPIVACAINE 1/10 % EPIDURAL INFUSION (WH - ANES): 14.0000 mL/h | INTRAMUSCULAR | Status: DC | PRN

## 2015-09-23 MED ORDER — DIPHENHYDRAMINE HCL 25 MG PO CAPS: 25.0000 mg | ORAL_CAPSULE | Freq: Four times a day (QID) | ORAL | Status: DC | PRN

## 2015-09-23 MED ORDER — SODIUM CHLORIDE 0.9 % IV SOLN
INTRAVENOUS | Status: DC
  Filled 2015-09-23: qty 2.5

## 2015-09-23 MED ORDER — FLEET ENEMA 7-19 GM/118ML RE ENEM: 1.0000 | ENEMA | RECTAL | Status: DC | PRN

## 2015-09-23 MED ORDER — OXYTOCIN 40 UNITS IN LACTATED RINGERS INFUSION - SIMPLE MED: 2.5000 [IU]/h | INTRAVENOUS | Status: DC

## 2015-09-23 MED ORDER — BENZOCAINE-MENTHOL 20-0.5 % EX AERO: 1.0000 "application " | INHALATION_SPRAY | CUTANEOUS | Status: DC | PRN

## 2015-09-23 MED ORDER — LACTATED RINGERS IV SOLN: INTRAVENOUS | Status: DC

## 2015-09-23 MED ORDER — OXYCODONE-ACETAMINOPHEN 5-325 MG PO TABS
1.0000 | ORAL_TABLET | ORAL | Status: DC | PRN
Start: 2015-09-23 — End: 2015-09-25

## 2015-09-23 MED ORDER — INSULIN REGULAR BOLUS VIA INFUSION: 0.0000 [IU] | Freq: Three times a day (TID) | INTRAVENOUS | Status: DC

## 2015-09-23 MED ORDER — DEXTROSE 50 % IV SOLN: 25.0000 mL | INTRAVENOUS | Status: DC | PRN

## 2015-09-23 MED ORDER — OXYTOCIN 40 UNITS IN LACTATED RINGERS INFUSION - SIMPLE MED
1.0000 m[IU]/min | INTRAVENOUS | Status: DC
Start: 2015-09-23 — End: 2015-09-23
  Administered 2015-09-23: 2 m[IU]/min via INTRAVENOUS
  Filled 2015-09-23: qty 1000

## 2015-09-23 MED ORDER — FENTANYL CITRATE (PF) 100 MCG/2ML IJ SOLN: 50.0000 ug | INTRAMUSCULAR | Status: DC | PRN

## 2015-09-23 MED ORDER — ZOLPIDEM TARTRATE 5 MG PO TABS: 5.0000 mg | ORAL_TABLET | Freq: Every evening | ORAL | Status: DC | PRN

## 2015-09-23 MED ORDER — LACTATED RINGERS IV SOLN: 500.0000 mL | Freq: Once | INTRAVENOUS | Status: DC

## 2015-09-23 MED ORDER — SOD CITRATE-CITRIC ACID 500-334 MG/5ML PO SOLN: 30.0000 mL | ORAL | Status: DC | PRN

## 2015-09-23 MED ORDER — MISOPROSTOL 200 MCG PO TABS
800.0000 ug | ORAL_TABLET | Freq: Once | ORAL | Status: AC
  Administered 2015-09-23: 800 ug via ORAL

## 2015-09-23 MED ORDER — TETANUS-DIPHTH-ACELL PERTUSSIS 5-2.5-18.5 LF-MCG/0.5 IM SUSP: 0.5000 mL | Freq: Once | INTRAMUSCULAR | Status: DC

## 2015-09-23 NOTE — MAU Note (Signed)
Urine in lab 

## 2015-09-23 NOTE — MAU Note (Signed)
Goes to the bathroom, "it is not clear" . No bleeding. ?fluid/mucous from vagina- started around 0600.  Vag pain when fluid comes out

## 2015-09-23 NOTE — Progress Notes (Signed)
Patient ID: Sierra Blanchard, female   DOB: 05-03-1975, 40 y.o.   MRN: [id number] Delivery Note At 8:43 PM a viable and healthy female was delivered via Vaginal, Spontaneous Delivery (Presentation: Vertex, occiput anterior;  ).  APGAR: 8, 9; weight 8 lb 12.6 oz (3985 g).   Placenta status: Intact [name] , .  Cord:  with the following complications: .  Cord pH: Not required or checked  Anesthesia:  Episiotomy: None Lacerations: None Suture Repair:  Est. Blood Loss (mL): 436  Mom to postpartum.  Baby to Couplet care / Skin to Skin. Delivery performed performed by myself and Ms. Deatra Ina, [facility] medical student Tilda Burrow 09/23/2015, 9:20 PM

## 2015-09-23 NOTE — Progress Notes (Signed)
Md spoke with pt about POC using stratus [place] [phone]
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
## 2015-09-24 ENCOUNTER — Inpatient Hospital Stay (HOSPITAL_COMMUNITY): Admission: RE | Admit: 2015-09-24 | Payer: Medicaid Other | Source: Ambulatory Visit

## 2015-09-24 LAB — CBC
HCT: 28.9 % — ABNORMAL LOW (ref 36.0–46.0)
Hemoglobin: 10 g/dL — ABNORMAL LOW (ref 12.0–15.0)
MCH: 31.7 pg (ref 26.0–34.0)
MCHC: 34.6 g/dL (ref 30.0–36.0)
MCV: 91.7 fL (ref 78.0–100.0)
PLATELETS: 192 10*3/uL (ref 150–400)
RBC: 3.15 MIL/uL — ABNORMAL LOW (ref 3.87–5.11)
RDW: 12.8 % (ref 11.5–15.5)
WBC: 20.3 10*3/uL — AB (ref 4.0–10.5)

## 2015-09-24 LAB — RPR: RPR Ser Ql: NONREACTIVE

## 2015-09-24 LAB — GLUCOSE, CAPILLARY: GLUCOSE-CAPILLARY: 99 mg/dL (ref 65–99)

## 2015-09-24 NOTE — Progress Notes (Signed)
[place] interpretor used for pt assessment, update infant feedings and discuss plan of care for the night. Offered to order dinner but pt said had own food. All questions addressed.

## 2015-09-24 NOTE — Progress Notes (Signed)
UR chart review completed.  

## 2015-09-24 NOTE — Progress Notes (Signed)
[place] interpretor used to finish admission teaching for pt.

## 2015-09-24 NOTE — Progress Notes (Signed)
[place] interpretor used for pt admission teaching. Pt speaks Nepali

## 2015-09-24 NOTE — Progress Notes (Signed)
Used [place] Nepali interpreter [place] [phone] to introduce myself to pt and family.Performed assessment, discussed pain, pt reports no pain, pt states that she does not want me to order her lunch, that someone is bringing food from home. Instructed to call nurse with any questions/concerns and will use video interpreter. Sherald Barge

## 2015-09-24 NOTE — Progress Notes (Signed)
Patient ID: Sierra Blanchard, female   DOB: 07-19-1975, 40 y.o.   MRN: [id number] Post Partum Day 1 Subjective: No acute events overnight. Patient endorses no pain and appropriate lochia. Denies SOB, dizziness, h/a.  Method of feeding bottle. Contraception nexplanon.   Objective: Blood pressure (!) 117/58, pulse 82, temperature 98.4 F (36.9 C), temperature source Oral, resp. rate 18, height 5\' 2"  (1.575 m), weight 68.5 kg (151 lb), last menstrual period 12/23/2014, unknown if currently breastfeeding.  Physical Exam:  General: alert, cooperative and no distress Lochia: appropriate Uterine Fundus: firm DVT Evaluation: No evidence of DVT seen on physical exam.   Recent Labs  09/23/15 1155 09/24/15 0525  HGB 11.4* 10.0*  HCT 33.8* 28.9*    Results for orders placed or performed during the hospital encounter of 09/23/15 (from the past 24 hour(s))  CBC     Status: Abnormal   Collection Time: 09/23/15 11:55 AM  Result Value Ref Range   WBC 14.4 (H) 4.0 - 10.5 K/uL   RBC 3.66 (L) 3.87 - 5.11 MIL/uL   Hemoglobin 11.4 (L) 12.0 - 15.0 g/dL   HCT 78.2 (L) 95.6 - 21.3 %   MCV 92.3 78.0 - 100.0 fL   MCH 31.1 26.0 - 34.0 pg   MCHC 33.7 30.0 - 36.0 g/dL   RDW 08.6 57.8 - 46.9 %   Platelets 235 150 - 400 K/uL  Type and screen [facility] [place]     Status: None   Collection Time: 09/23/15 11:55 AM  Result Value Ref Range   ABO/RH(D) B POS    Antibody Screen NEG    Sample Expiration 09/26/2015   RPR     Status: None   Collection Time: 09/23/15 11:55 AM  Result Value Ref Range   RPR Ser Ql Non Reactive Non Reactive  Glucose, capillary     Status: Abnormal   Collection Time: 09/23/15 12:27 PM  Result Value Ref Range   Glucose-Capillary 133 (H) 65 - 99 mg/dL  Glucose, capillary     Status: Abnormal   Collection Time: 09/23/15  2:24 PM  Result Value Ref Range   Glucose-Capillary 144 (H) 65 - 99 mg/dL  Glucose, capillary     Status: Abnormal   Collection Time: 09/23/15  5:09  PM  Result Value Ref Range   Glucose-Capillary 112 (H) 65 - 99 mg/dL  Glucose, capillary     Status: None   Collection Time: 09/23/15  6:07 PM  Result Value Ref Range   Glucose-Capillary 87 65 - 99 mg/dL  Glucose, capillary     Status: None   Collection Time: 09/23/15  7:01 PM  Result Value Ref Range   Glucose-Capillary 98 65 - 99 mg/dL  CBC     Status: Abnormal   Collection Time: 09/24/15  5:25 AM  Result Value Ref Range   WBC 20.3 (H) 4.0 - 10.5 K/uL   RBC 3.15 (L) 3.87 - 5.11 MIL/uL   Hemoglobin 10.0 (L) 12.0 - 15.0 g/dL   HCT 62.9 (L) 52.8 - 41.3 %   MCV 91.7 78.0 - 100.0 fL   MCH 31.7 26.0 - 34.0 pg   MCHC 34.6 30.0 - 36.0 g/dL   RDW 24.4 01.0 - 27.2 %   Platelets 192 150 - 400 K/uL  Glucose, capillary     Status: None   Collection Time: 09/24/15  6:08 AM  Result Value Ref Range   Glucose-Capillary 99 65 - 99 mg/dL    Assessment/Plan: Sierra Blanchard is a 39yo  [id number] PPD#1 SVD w/ A1GDM, on lovenox for protein S deficiency. Fasting glucose postpartum within normal limits. Doing well. Will restart Lovenox this morning.   Plan for discharge tomorrow and Contraception Nexplanon  Lovenox 70 mg IM q24hrs starting 7/27 @ 0800 GDM: fasting glucose 7/27 WNL   LOS: 1 day   Maryelizabeth Kaufmann 09/24/2015, 7:08 AM   CNM attestation Post Partum Day #1  Sierra Blanchard is a 40 y.o. [id number] s/p SVD.  Pt denies problems with ambulating, voiding or po intake. Pain is well controlled.  Plan for birth control is Nexplanon.  Method of Feeding: bottle  PE:  BP (!) 117/58 (BP Location: Right Arm)   Pulse 82   Temp 98.4 F (36.9 C) (Oral)   Resp 18   Ht  (1.575 m)   Wt 68.5 kg (151 lb)   LMP 12/23/2014 (Approximate)   Breastfeeding? Unknown   BMI 27.62 kg/m  Fundus firm  Plan for discharge: 09/25/15 Continue on Lovenox  Cam Hai, CNM 9:10 AM  09/24/2015

## 2015-09-25 MED ORDER — ACETAMINOPHEN 325 MG PO TABS: 650.0000 mg | ORAL_TABLET | ORAL | 1 refills | Status: DC | PRN

## 2015-09-25 MED ORDER — IBUPROFEN 600 MG PO TABS: 600.0000 mg | ORAL_TABLET | Freq: Four times a day (QID) | ORAL | 0 refills | Status: DC

## 2015-09-25 MED ORDER — PRENATAL MULTIVITAMIN CH: 1.0000 | ORAL_TABLET | Freq: Every day | ORAL | 1 refills | Status: DC

## 2015-09-25 MED ORDER — ENOXAPARIN SODIUM 150 MG/ML ~~LOC~~ SOLN: SUBCUTANEOUS | 1 refills | Status: DC

## 2015-09-25 NOTE — Discharge Instructions (Signed)
Activity as tolerated, nothing in vagina for 2 weeks. Follow up in clinic in 4-6 weeks, or sooner for Nexplanon placement for contraception. Continue Lovenox for 6 weeks after delivery.

## 2015-09-25 NOTE — Discharge Summary (Signed)
Obstetric Discharge Summary Reason for Admission: onset of labor Prenatal Procedures: NST and ultrasound Intrapartum Procedures: spontaneous vaginal delivery Postpartum Procedures: none Complications-Operative and Postpartum: none Hemoglobin  Date Value Ref Range Status  09/24/2015 10.0 (L) 12.0 - 15.0 g/dL Final   HCT  Date Value Ref Range Status  09/24/2015 28.9 (L) 36.0 - 46.0 % Final    Physical Exam:  General: alert, cooperative and no distress Lochia: appropriate Uterine Fundus: firm Incision: NA DVT Evaluation: No evidence of DVT seen on physical exam.  Discharge Diagnoses: Term Pregnancy-delivered  Discharge Information: Date: 09/25/2015 Activity: pelvic rest Diet: routine Medications: Tylenol & Ibuprofen for pain prn; Lovenox for 6 weeks for Protein S def Condition: stable Instructions: refer to practice specific booklet Discharge to: home   Newborn Data: Live born female  Birth Weight: 8 lb 12.6 oz (3985 g) APGAR: 8, 9  Home with mother.  Andres Ege, MD, PGY-1, MPH 09/25/2015, 9:15 AM   CNM attestation I have seen and examined this patient and agree with above documentation in the resident's note.   Barbarella Wiker is a 40 y.o. [id number] s/p SVD.   Pain is well controlled.  Plan for birth control is Nexplanon.  Method of Feeding: bottle  PE:  BP (!) 117/51 (BP Location: Right Arm)   Pulse 68   Temp 97.9 F (36.6 C) (Oral)   Resp 18   Ht 5\' 2"  (1.575 m)   Wt 68.5 kg (151 lb)   LMP 12/23/2014 (Approximate)   Breastfeeding? Unknown   BMI 27.62 kg/m  Fundus firm   Recent Labs  09/23/15 1155 09/24/15 0525  HGB 11.4* 10.0*  HCT 33.8* 28.9*     Plan: discharge today - postpartum care discussed - f/u clinic in 6 weeks for postpartum visit: interpreter in room and will call today and make appt - questions answered re Lovenox and Nexplanon   Abigail Teall, CNM 10:30 AM  09/25/2015

## 2015-10-13 ENCOUNTER — Encounter: Payer: Self-pay | Admitting: General Practice

## 2015-10-15 ENCOUNTER — Other Ambulatory Visit: Payer: Self-pay | Admitting: *Deleted

## 2015-10-15 MED ORDER — ENOXAPARIN SODIUM 150 MG/ML ~~LOC~~ SOLN: SUBCUTANEOUS | 1 refills | Status: DC

## 2015-10-28 ENCOUNTER — Other Ambulatory Visit: Payer: Self-pay | Admitting: Obstetrics & Gynecology

## 2015-10-28 ENCOUNTER — Ambulatory Visit (INDEPENDENT_AMBULATORY_CARE_PROVIDER_SITE_OTHER): Payer: Medicaid Other | Admitting: Clinical

## 2015-10-28 ENCOUNTER — Encounter: Payer: Self-pay | Admitting: Advanced Practice Midwife

## 2015-10-28 ENCOUNTER — Ambulatory Visit (INDEPENDENT_AMBULATORY_CARE_PROVIDER_SITE_OTHER): Payer: Medicaid Other | Admitting: Advanced Practice Midwife

## 2015-10-28 DIAGNOSIS — F4323 Adjustment disorder with mixed anxiety and depressed mood: Secondary | ICD-10-CM | POA: Diagnosis not present

## 2015-10-28 DIAGNOSIS — F53 Postpartum depression: Secondary | ICD-10-CM | POA: Insufficient documentation

## 2015-10-28 DIAGNOSIS — O99345 Other mental disorders complicating the puerperium: Secondary | ICD-10-CM

## 2015-10-28 DIAGNOSIS — Z3202 Encounter for pregnancy test, result negative: Secondary | ICD-10-CM | POA: Diagnosis not present

## 2015-10-28 DIAGNOSIS — Z30011 Encounter for initial prescription of contraceptive pills: Secondary | ICD-10-CM

## 2015-10-28 LAB — POCT PREGNANCY, URINE: Preg Test, Ur: NEGATIVE

## 2015-10-28 MED ORDER — SERTRALINE HCL 50 MG PO TABS: 50.0000 mg | ORAL_TABLET | Freq: Every day | ORAL | 5 refills | Status: DC

## 2015-10-28 MED ORDER — NORETHINDRONE 0.35 MG PO TABS: 1.0000 | ORAL_TABLET | Freq: Every day | ORAL | 11 refills | Status: DC

## 2015-10-28 NOTE — Addendum Note (Signed)
Addended by: Jaynie CollinsANYANWU, UGONNA A on: 10/28/2015 12:29 PM   Modules accepted: Orders

## 2015-10-28 NOTE — Progress Notes (Signed)
Pt given FMLA paperwork  Pt agrees to see Asher MuirJamie for score of 10 on postpartum depression screening.

## 2015-10-28 NOTE — Patient Instructions (Signed)
Oral Contraception Use Oral contraceptive pills (OCPs) are medicines taken to prevent pregnancy. OCPs work by preventing the ovaries from releasing eggs. The hormones in OCPs also cause the cervical mucus to thicken, preventing the sperm from entering the uterus. The hormones also cause the uterine lining to become thin, not allowing a fertilized egg to attach to the inside of the uterus. OCPs are highly effective when taken exactly as prescribed. However, OCPs do not prevent sexually transmitted diseases (STDs). Safe sex practices, such as using condoms along with an OCP, can help prevent STDs. Before taking OCPs, you may have a physical exam and Pap test. Your health care provider may also order blood tests if necessary. Your health care provider will make sure you are a good candidate for oral contraception. Discuss with your health care provider the possible side effects of the OCP you may be prescribed. When starting an OCP, it can take 2 to 3 months for the body to adjust to the changes in hormone levels in your body.  HOW TO TAKE ORAL CONTRACEPTIVE PILLS Your health care provider may advise you on how to start taking the first cycle of OCPs. Otherwise, you can:   Start on day 1 of your menstrual period. You will not need any backup contraceptive protection with this start time.   Start on the first Sunday after your menstrual period or the day you get your prescription. In these cases, you will need to use backup contraceptive protection for the first week.   Start the pill at any time of your cycle. If you take the pill within 5 days of the start of your period, you are protected against pregnancy right away. In this case, you will not need a backup form of birth control. If you start at any other time of your menstrual cycle, you will need to use another form of birth control for 7 days. If your OCP is the type called a minipill, it will protect you from pregnancy after taking it for 2 days (48  hours). After you have started taking OCPs:   If you forget to take 1 pill, take it as soon as you remember. Take the next pill at the regular time.   If you miss 2 or more pills, call your health care provider because different pills have different instructions for missed doses. Use backup birth control until your next menstrual period starts.   If you use a 28-day pack that contains inactive pills and you miss 1 of the last 7 pills (pills with no hormones), it will not matter. Throw away the rest of the non-hormone pills and start a new pill pack.  No matter which day you start the OCP, you will always start a new pack on that same day of the week. Have an extra pack of OCPs and a backup contraceptive method available in case you miss some pills or lose your OCP pack.  HOME CARE INSTRUCTIONS   Do not smoke.   Always use a condom to protect against STDs. OCPs do not protect against STDs.   Use a calendar to mark your menstrual period days.   Read the information and directions that came with your OCP. Talk to your health care provider if you have questions.  SEEK MEDICAL CARE IF:   You develop nausea and vomiting.   You have abnormal vaginal discharge or bleeding.   You develop a rash.   You miss your menstrual period.   You are losing   your hair.   You need treatment for mood swings or depression.   You get dizzy when taking the OCP.   You develop acne from taking the OCP.   You become pregnant.  SEEK IMMEDIATE MEDICAL CARE IF:   You develop chest pain.   You develop shortness of breath.   You have an uncontrolled or severe headache.   You develop numbness or slurred speech.   You develop visual problems.   You develop pain, redness, and swelling in the legs.    This information is not intended to replace advice given to you by your health care provider. Make sure you discuss any questions you have with your health care provider.   Document  Released: 02/03/2011 Document Revised: 03/07/2014 Document Reviewed: 08/05/2012 Elsevier Interactive Patient Education 2016 Elsevier Inc.  

## 2015-10-28 NOTE — Progress Notes (Signed)
  ASSESSMENT: Pt currently experiencing Adjustment disorder with mixed anxious and depressed mood. Pt needs to establish care with PCP. Pt would benefit from brief therapeutic intervention regarding coping with symptoms of anxiety and depression. Pt may benefit from additional healthcare navigation.  Stage of Change: contemplative  PLAN: 1. F/U with behavioral health clinician in one month, or as needed (Even if Medicaid runs out, Asher MuirJamie will still see her for initial BH med f/u) 2. Psychiatric Medications: Zoloft, began today 3. Behavioral recommendations:   -[facility] -Consider negotiating with work supervisor about cutting back hours at work -Consider re-establishing medical care with Dr. Channing MuttersAbvuerre(listed as her PCP) or establishing PCP for uninsured([facility]) before medications run out -Consider reapplying for Medicaid and/or [facility] for other [facility] options -Continue to accept help with baby from supportive family and friends  SUBJECTIVE: Pt. referred by Wynelle BourgeoisMarie Williams, CNM,  for symptoms of depression Pt. reports the following symptoms/concerns: Pt states that she has not felt anxious/depressed after previous pregnancies; feels so tired that she has difficulty getting out of bed, excessive worry throughout the day, hard to fall asleep, isolating from talking to friends and family; primary concern is overwhelming tiredness and not feeling able to go back to work fulltime, as well as concern about losing Medicaid soon. Pt requesting medication to help feel better; has family to help with baby at home. Duration of problem: Less than one month Severity: moderate   OBJECTIVE: Orientation & Cognition: Oriented x3. Thought processes normal and appropriate to situation. Mood: low Affect: appropriate Appearance: appropriate Risk of harm to self or others: no known risk of harm to self or others Substance use:  none Assessments administered: [place]: 10 (w further questioning, she admits to sleep issues)  Diagnosis: Adjustment disorder with mixed anxiety and depressed mood CPT Code: F43.23  -------------------------------------------- Other(s) present in the room: Nepali video interpreter  Time spent with patient in exam room: 40 minutes, 12:20-1:00pm

## 2015-10-28 NOTE — Progress Notes (Addendum)
Video Interpreter # [id number] Subjective:     Sierra Blanchard is a 40 y.o. female who presents for a postpartum visit. She is 5 weeks postpartum following a spontaneous vaginal delivery. I have fully reviewed the prenatal and intrapartum course. The delivery was at 2424w1d gestational weeks. Outcome: spontaneous vaginal delivery. Anesthesia: none. Postpartum course has been uneventful (continues to take Lovenox every evening at 5pm). Baby's course has been uneventful. Baby is feeding by bottle - Similac Advance. Bleeding staining only. Bowel function is normal. Bladder function is normal. Patient is not sexually active. Contraception method is Nexplanon. Postpartum depression screening: negative, but score is 10.  Agrees to see counselor  The following portions of the patient's history were reviewed and updated as appropriate: allergies, current medications, past family history, past medical history, past social history, past surgical history and problem list.  Review of Systems Pertinent items are noted in HPI.   Objective:    BP (!) 84/45   Pulse 89   Wt 135 lb 1.6 oz (61.3 kg)   BMI 24.71 kg/m   General:  alert, cooperative and no distress   Breasts:  inspection negative, no nipple discharge or bleeding, no masses or nodularity palpable  Lungs: clear to auscultation bilaterally  Heart:  regular rate and rhythm, S1, S2 normal, no murmur, click, rub or gallop  Abdomen: soft, non-tender; bowel sounds normal; no masses,  no organomegaly   Vulva:  not evaluated  Vagina: not evaluated  Cervix:  n/a  Corpus: not examined  Adnexa:  not evaluated  Rectal Exam: Not performed.        Assessment:     Normal postpartum exam. Pap smear not done at today's visit.   Plan:    1. Contraception: oral progesterone-only contraceptive 2. Patient initially said she wanted a Nexplanon. Risks and benefits of Nexplanon reviewed and pt indicated she wanted it. Video interpretor used.  We consented her and  prepped her for the procedure.  She began pulling away with Betadine.  Considerable time spent on discussing procedure, but patient remained VERY frightened.  Procedure aborted.  Would not hold arm still, kept pulling away.   Discussed other options of Mirena IUD and PG only pills, or DepoProvera. Patient cannot take estrogen methods.  Wants to use minipills.  Discussed now to take, and how to get refills at Pharmacy Will Rx Micronor  X 1 year    May come back if she changes her mind 3. Follow up in: 1 year or as needed.

## 2015-10-28 NOTE — Progress Notes (Signed)
Video Interpreter # [id number] Subjective:     Sierra Blanchard is a 40 y.o. female who presents for a postpartum visit. She is 5 weeks postpartum following a spontaneous vaginal delivery. I have fully reviewed the prenatal and intrapartum course. The delivery was at 38w1dgestational weeks. Outcome: spontaneous vaginal delivery. Anesthesia: none. Postpartum course has been uneventful (continues to take Lovenox every evening at 5pm). Baby's course has been uneventful. Baby is feeding by bottle - Similac Advance. Bleeding staining only. Bowel function is normal. Bladder function is normal. Patient is not sexually active. Contraception method is Nexplanon. Postpartum depression screening: negative, but score is 10.  Agrees to see counselor  The following portions of the patient's history were reviewed and updated as appropriate: allergies, current medications, past family history, past medical history, past social history, past surgical history and problem list.  Review of Systems Pertinent items are noted in HPI.   Objective:    BP (!) 84/45   Pulse 89   Wt 135 lb 1.6 oz (61.3 kg)   BMI 24.71 kg/m   General:  alert, cooperative and no distress   Breasts:  inspection negative, no nipple discharge or bleeding, no masses or nodularity palpable  Lungs: clear to auscultation bilaterally  Heart:  regular rate and rhythm, S1, S2 normal, no murmur, click, rub or gallop  Abdomen: soft, non-tender; bowel sounds normal; no masses,  no organomegaly   Vulva:  not evaluated  Vagina: not evaluated  Cervix:  n/a  Corpus: not examined  Adnexa:  not evaluated  Rectal Exam: Not performed.        Assessment:     Normal postpartum exam. Pap smear not done at today's visit.   Plan:    1. Contraception: oral progesterone-only contraceptive 2. Patient initially said she wanted a Nexplanon. Risks and benefits of Nexplanon reviewed and pt indicated she wanted it. Video interpretor used.  We consented her and  prepped her for the procedure.  She began pulling away with Betadine.  Considerable time spent on discussing procedure, but patient remained VERY frightened.  Procedure aborted.  Would not hold arm still, kept pulling away.   Discussed other options of Mirena IUD and PG only pills, or DepoProvera. Patient cannot take estrogen methods.  Wants to use minipills.  Discussed now to take, and how to get [facility]    May come back if she changes her mind 3. Follow up in: 1 year or as needed.   MHansel Feinstein CNM   Attestation of Attending Supervision of Advanced Practice Provider (PA/CNM/NP): Evaluation and management procedures were performed by the Advanced Practice Provider under my supervision and collaboration.  I have reviewed the Advanced Practice Provider's note and chart, and I agree with the management and plan.  Patient met with counselor who recommended antidepressant therapy. Zoloft 50 mg daily was prescribed. Will follow up as indicated by counselor; we appreciate her expertise and input in the care of this patient.  UVerita Schneiders MD, FLawson HeightsAttending [facility]

## 2015-11-11 ENCOUNTER — Ambulatory Visit (INDEPENDENT_AMBULATORY_CARE_PROVIDER_SITE_OTHER): Payer: Medicaid Other | Admitting: Obstetrics & Gynecology

## 2015-11-11 ENCOUNTER — Encounter: Payer: Self-pay | Admitting: Obstetrics & Gynecology

## 2015-11-11 VITALS — BP 130/70 | HR 74 | Wt 137.0 lb

## 2015-11-11 DIAGNOSIS — Z30017 Encounter for initial prescription of implantable subdermal contraceptive: Secondary | ICD-10-CM | POA: Diagnosis not present

## 2015-11-11 DIAGNOSIS — Z3202 Encounter for pregnancy test, result negative: Secondary | ICD-10-CM

## 2015-11-11 LAB — POCT PREGNANCY, URINE: Preg Test, Ur: NEGATIVE

## 2015-11-11 MED ORDER — ETONOGESTREL 68 MG ~~LOC~~ IMPL: 68.0000 mg | DRUG_IMPLANT | Freq: Once | SUBCUTANEOUS | Status: DC

## 2015-11-11 MED ORDER — ETONOGESTREL 68 MG ~~LOC~~ IMPL
68.0000 mg | DRUG_IMPLANT | Freq: Once | SUBCUTANEOUS | Status: AC
  Administered 2015-11-11: 68 mg via SUBCUTANEOUS

## 2015-11-11 NOTE — Patient Instructions (Signed)
Return to clinic for any scheduled appointments or for any gynecologic concerns as needed.   

## 2015-11-11 NOTE — Progress Notes (Signed)
Video interpreter [phone]#[id number]

## 2015-11-11 NOTE — Progress Notes (Signed)
     GYNECOLOGY CLINIC PROCEDURE NOTE  Sierra Blanchard is a 40 y.o. [id number] here for Nexplanon insertion. No other gynecologic concerns. Nepali video interpreter used.   Nexplanon Insertion Procedure Patient identified, informed consent performed, consent signed.   Patient does understand that irregular bleeding is a very common side effect of this medication. She was advised to have backup contraception for one week after placement. Pregnancy test in clinic today was negative.  Appropriate time out taken.  Patient's left arm was prepped and draped in the usual sterile fashion. The ruler used to measure and mark insertion area.  Patient was prepped with alcohol swab and then injected with 3 ml of 1% lidocaine.  She was prepped with betadine, Nexplanon removed from packaging,  Device confirmed in needle, then inserted full length of needle and withdrawn per handbook instructions. Nexplanon was able to palpated in the patient's arm; patient palpated the insert herself. There was minimal blood loss.  Patient insertion site covered with guaze and a pressure bandage to reduce any bruising.  The patient tolerated the procedure well and was given post procedure instructions.    [name]  Eshawn Coor, MD, FACOG Attending Obstetrician & Gynecologist, [facility], [facility]

## 2015-12-02 ENCOUNTER — Ambulatory Visit: Payer: Self-pay | Admitting: Obstetrics and Gynecology

## 2016-09-27 IMAGING — US US MFM OB DETAIL+14 WK
1 series · 13 of 28 positions shown · non-contrast
Comparison: none

[facility] Clinic
[REDACTED]-
Faculty Physician

Indications
30 weeks gestation of pregnancy
Detailed fetal anatomic survey                 [id number]
Advanced maternal age multigravida 35+,
third trimester
Poor obstetrical history (H/O DVT
postpartum)
OB History
Gravidity:    6         Term:   5
Living:       5
Fetal Evaluation
Num Of Fetuses:     1
Fetal Heart         130
Rate(bpm):
Cardiac Activity:   Observed
Presentation:       Cephalic
Placenta:           Posterior, above cervical os
P. Cord Insertion:  Visualized
Amniotic Fluid
AFI FV:      Subjectively within normal limits
AFI Sum(cm)     %Tile       Largest Pocket(cm)
18.15           68
RUQ(cm)       RLQ(cm)       LUQ(cm)        LLQ(cm)
4.39
Biometry
BPD:      75.1  mm     G. Age:  30w 1d         42  %    CI:        69.12   %    70 - 86
FL/HC:      21.0   %    19.2 -
HC:      288.5  mm     G. Age:  31w 5d         65  %    HC/AC:      1.07        0.99 -
AC:      270.4  mm     G. Age:  31w 1d         77  %    FL/BPD:     80.6   %    71 - 87
FL:       60.5  mm     G. Age:  31w 3d         76  %    FL/AC:      22.4   %    20 - 24
HUM:      54.3  mm     G. Age:  31w 4d         80  %
CER:      39.4  mm     G. Age:  33w 3d       > 95  %
CM:        8.1  mm
Est. FW:    4914  gm    3 lb 13 oz      75  %
Gestational Age
LMP:           30w 0d        Date:  12/23/14                 EDD:   09/29/15
U/S Today:     31w 1d                                        EDD:   09/21/15
Best:          30w 0d     Det. By:  LMP  (12/23/14)          EDD:   09/29/15
Anatomy
Cranium:               Appears normal         LVOT:                   Appears normal
Cavum:                 Appears normal         Aortic Arch:            Appears normal
Ventricles:            Appears normal         Ductal Arch:            Appears normal
Choroid Plexus:        Appears normal         Diaphragm:              Appears normal
Cerebellum:            Appears normal         Stomach:                Appears normal, left
sided
Posterior Fossa:       Appears normal         Abdomen:                Appears normal
Nuchal Fold:           Not applicable (>20    Abdominal Wall:         Appears nml (cord
wks GA)                                        insert, abd wall)
Face:                  Orbits appear          Cord Vessels:           Appears normal (3
normal                                         vessel cord)
Lips:                  Appears normal         Kidneys:                Appear normal
Palate:                Appears normal         Bladder:                Appears normal
Thoracic:              Appears normal         Spine:                  Appears normal
Heart:                 Appears normal         Upper Extremities:      Visualized
(4CH, axis, and situs
RVOT:                  Appears normal         Lower Extremities:      Visualized
Other:  Technically difficult due to fetal position. Fetus appears to be a
female. Heels visualized.
Cervix Uterus Adnexa
Cervix
Length:           3.62  cm.
Normal appearance by transabdominal scan.
Uterus
No abnormality visualized.
Left Ovary
Not visualized.
Right Ovary
Adnexa:       No abnormality visualized. No adnexal mass
visualized.
Impression
INDICATION: 39 yr old [id number] at 40w0d with history of DVT
for fetal anatomic survey.

[Series 1: us mfm ob detail+14 wk · 132 acquisitions, 13 frames shown]
[im 5/132]
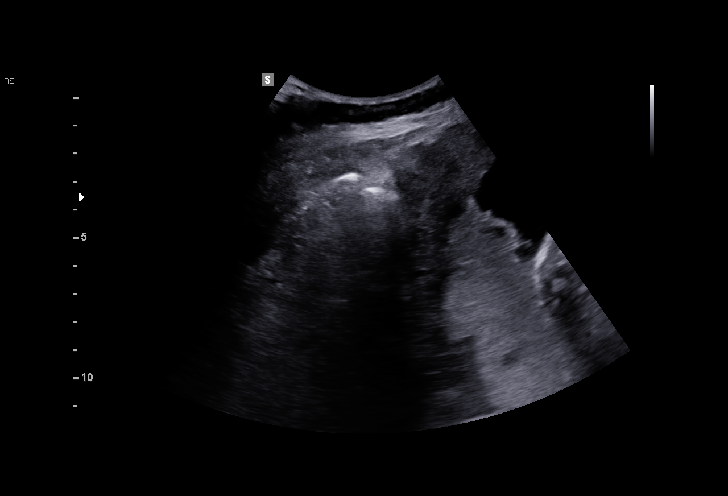
[im 15/132]
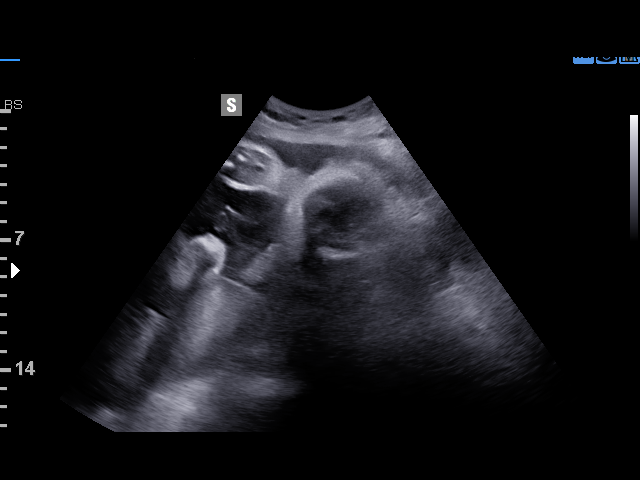
[im 25/132]
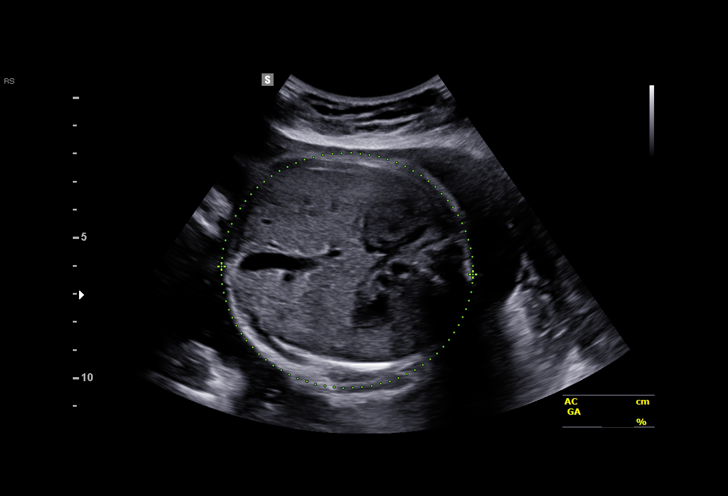
[im 34/132]
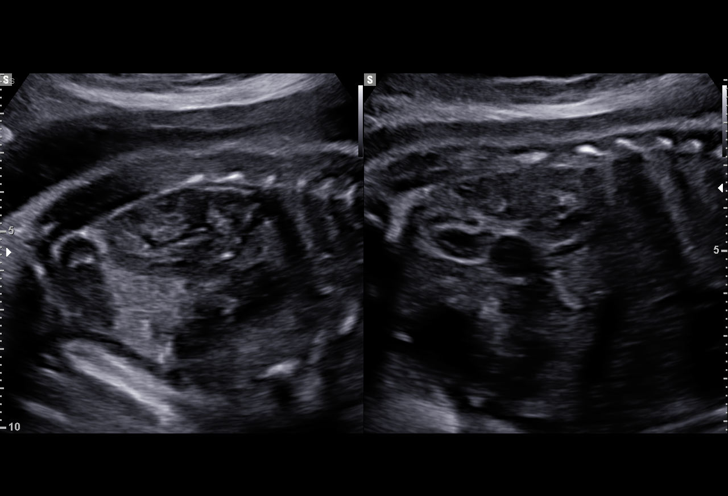
[im 44/132]
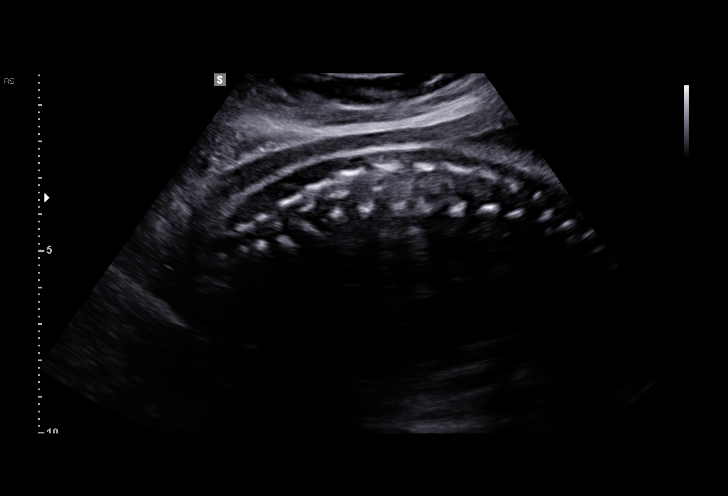
[im 54/132]
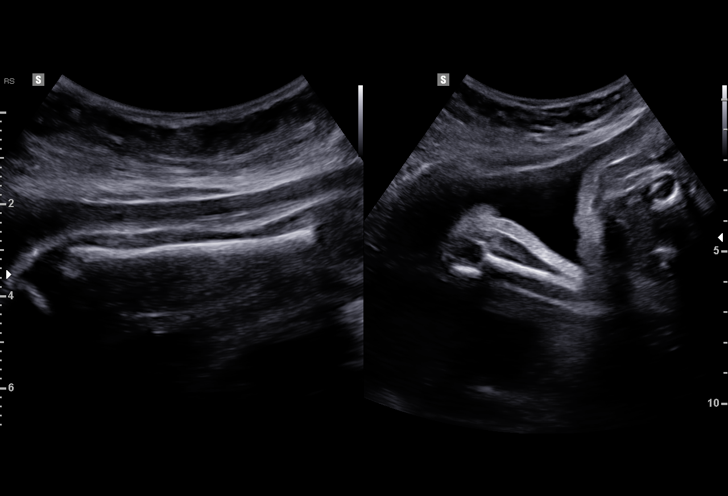
[im 68/132]
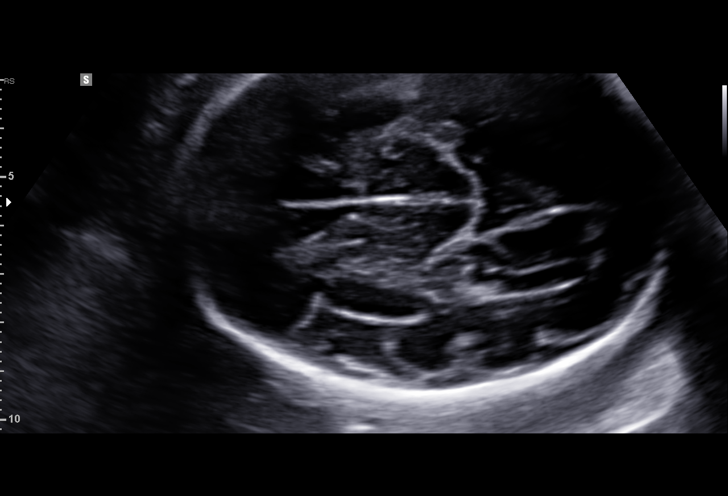
[im 78/132]
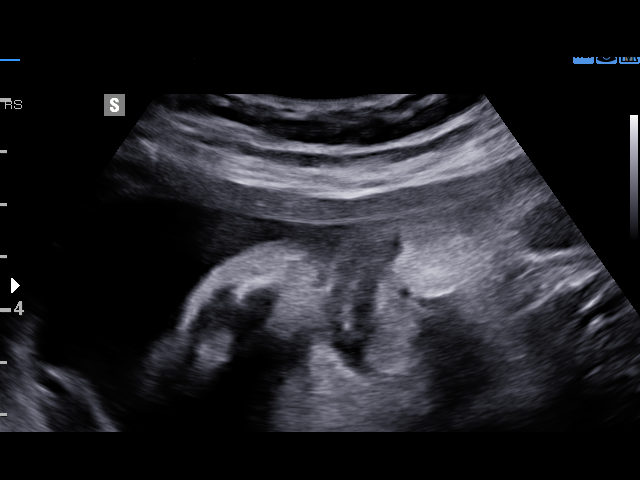
[im 88/132]
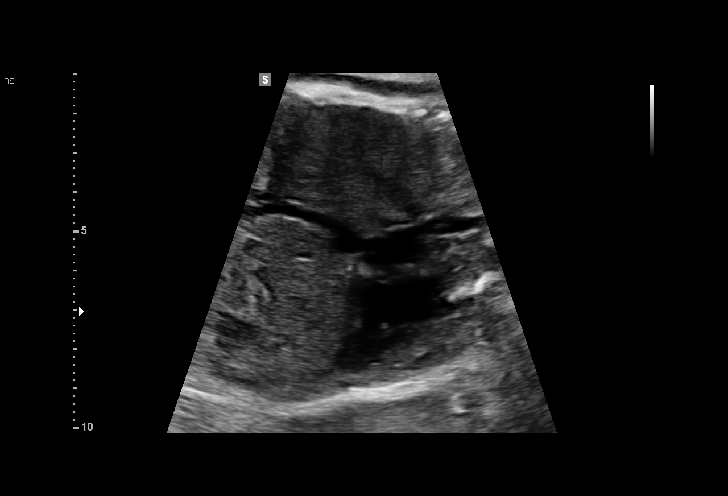
[im 98/132]
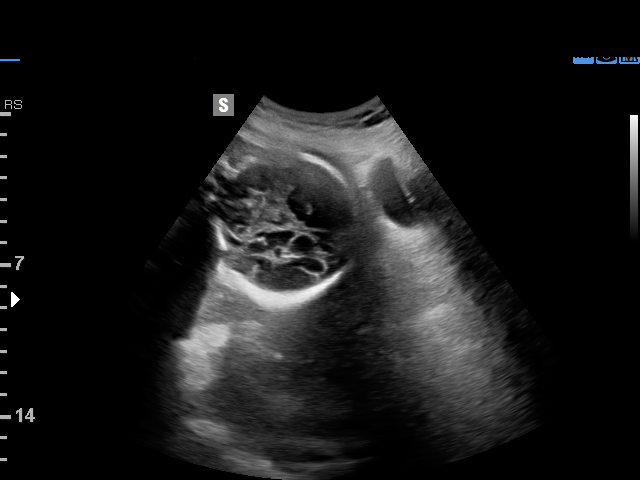
[im 107/132]
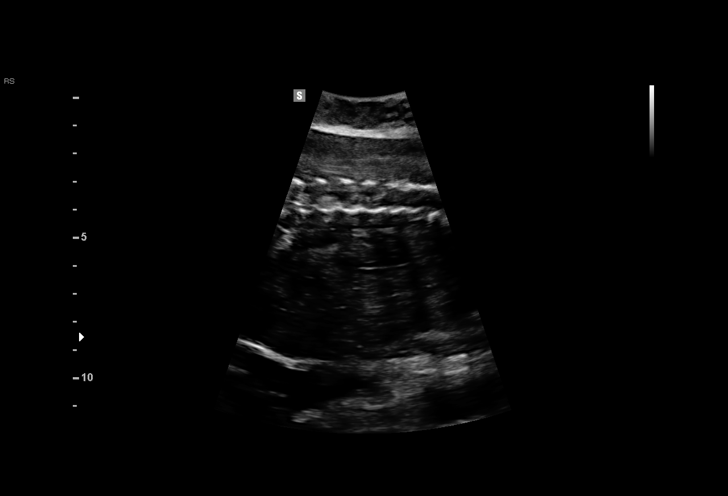
[im 117/132]
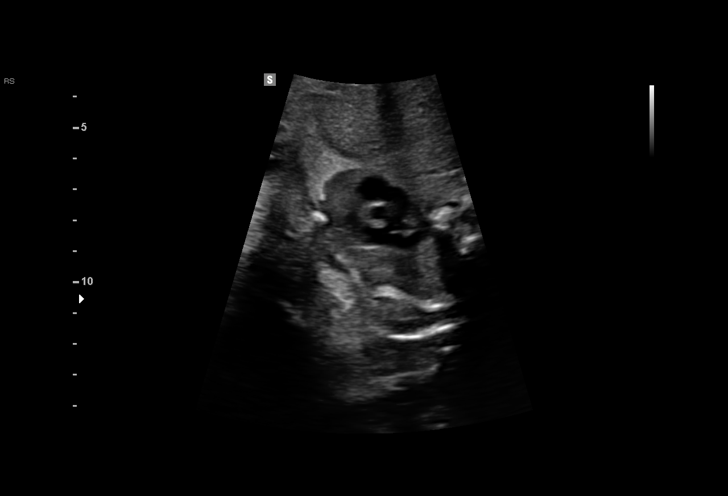
[im 127/132]
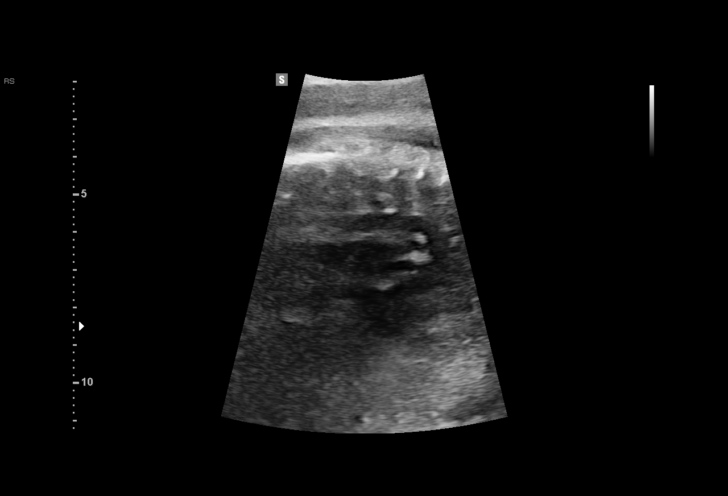

[13 of 28 positions shown; findings below may reference images not displayed]

FINDINGS: 1. Single intrauterine pregnancy.
2. Estimated fetal weight is in the 75th%.
3. Posterior placenta without evidence of previa.
4. Normal amniotic fluid index.
5. Normal transabdominal cervical length.
6. The views of the hands are limited.
7. The remainder of the fetal anatomic survey is normal.
Recommendations

1. Appropriate fetal growth.
2. Normal fetal anatomic survey:
- views of hands are limited
- discussed limitations of ultrasound in detecting fetal
anomalies
3. Advanced maternal age:
- previously counseled
- desires cell free fetal DNA which was drawn today
- recommend fetal every 4 weeks
- recommend start antenatal testing at 32 weeks
- recommend delivery by estimated due date
4. History of deep venous thrombosis:
- see consult letter
- recommend obtain thrombophilia panel (anticardiolipin
antibodies, lupus anti coagluant, factor V, prothrombin gene
mutation, protein S. and protein C
- recommend start lovenox today 40mg qd and continue until
6 weeks postpartum

## 2016-10-12 IMAGING — US US MFM FETAL BPP W/O NON-STRESS
1 series · 11 of 11 positions shown · non-contrast
Comparison: none

[Series 1: us mfm fetal bpp w/o non-stress · 11 acquisitions, 11 frames shown]
[im 1/11]
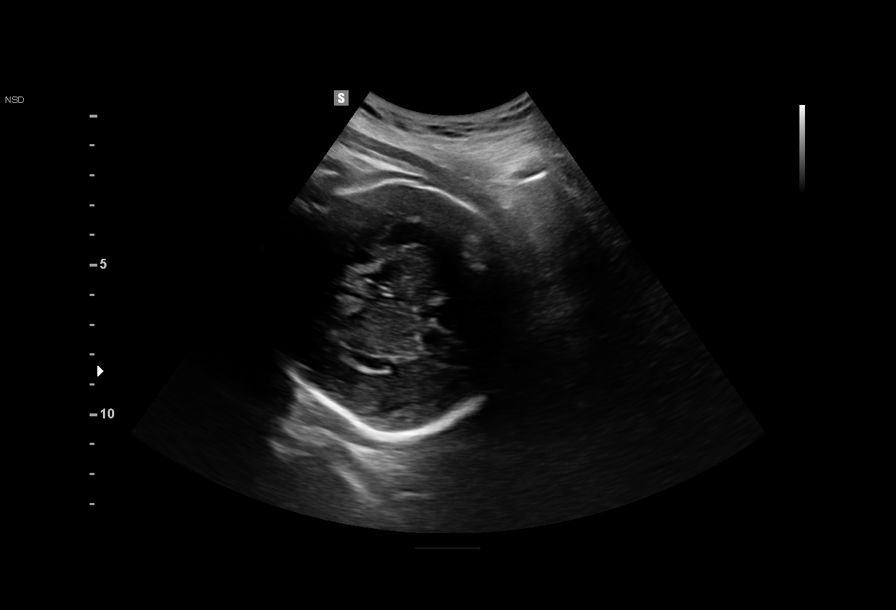
[im 2/11]
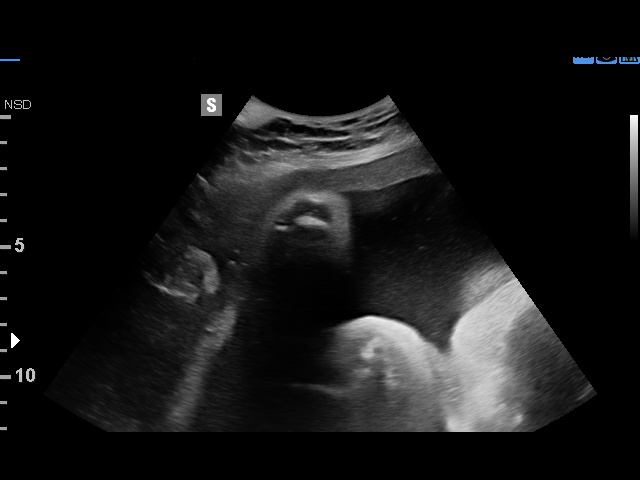
[im 3/11]
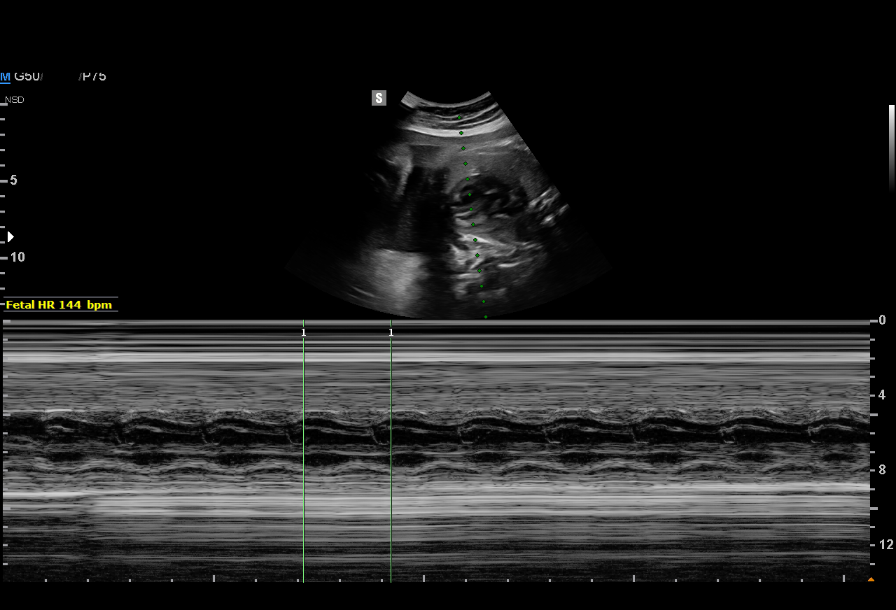
[im 4/11]
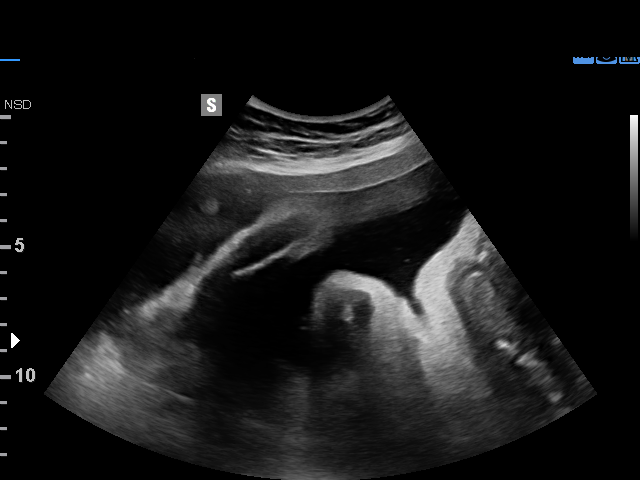
[im 5/11]
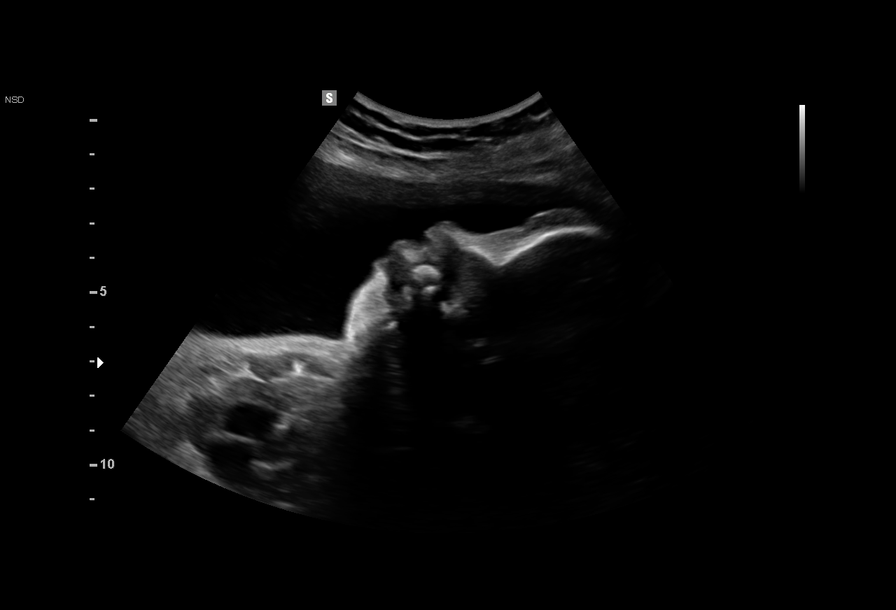
[im 6/11]
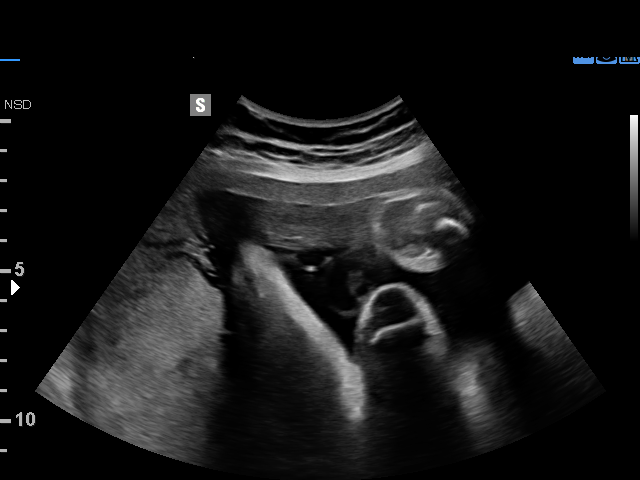
[im 7/11]
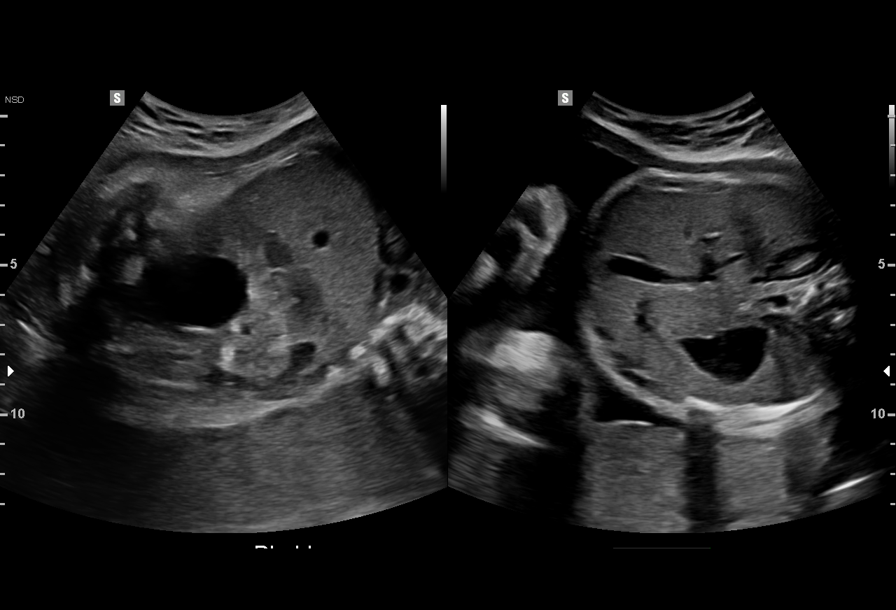
[im 8/11]
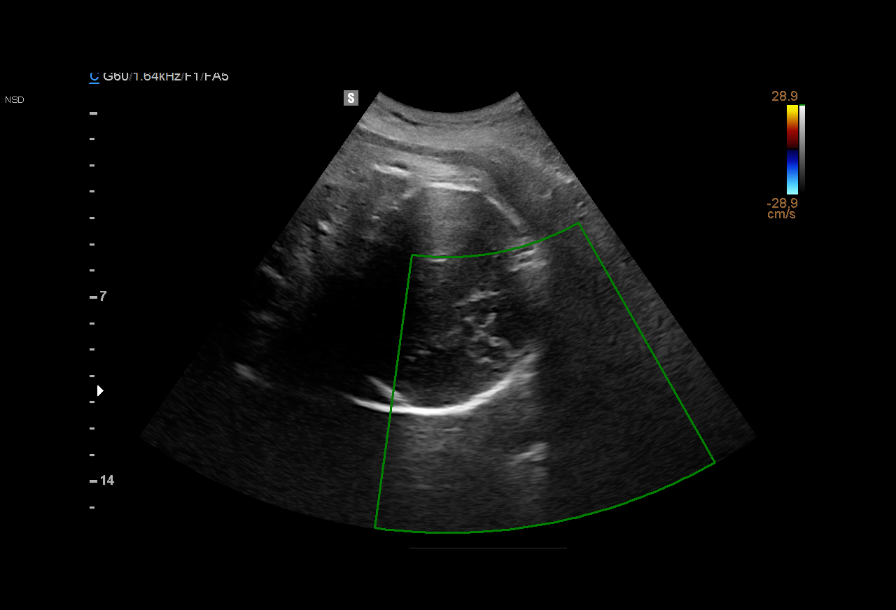
[im 9/11]
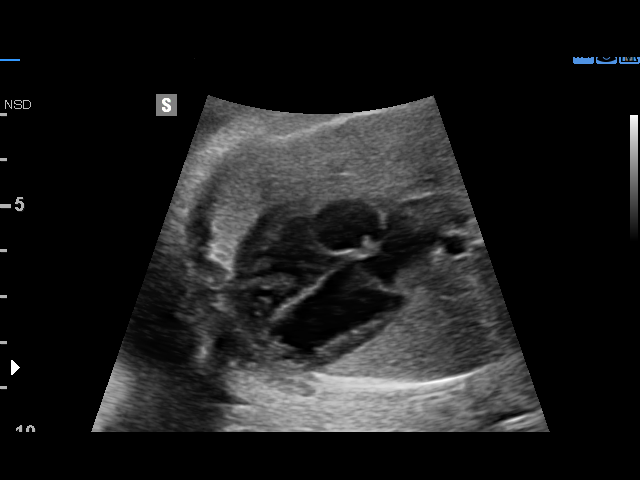
[im 10/11]
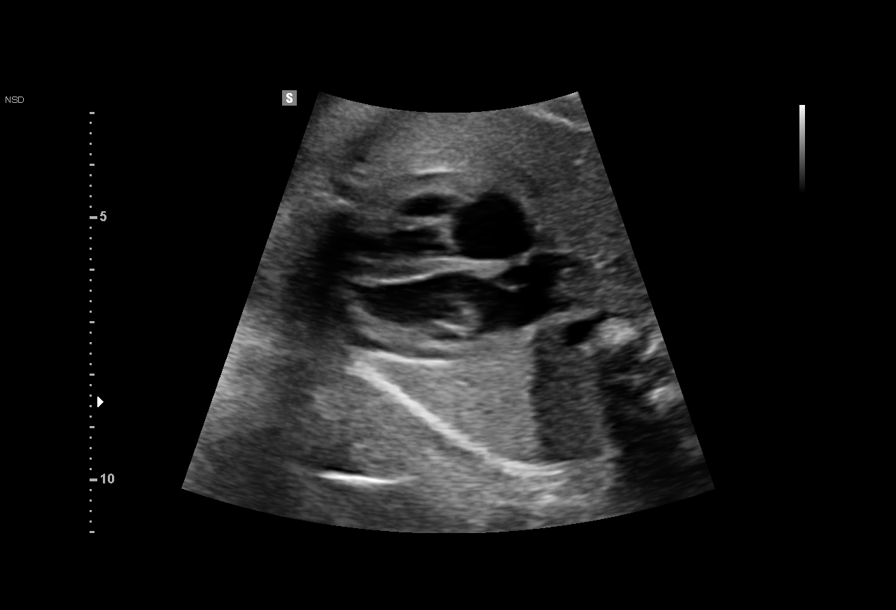
[im 11/11]
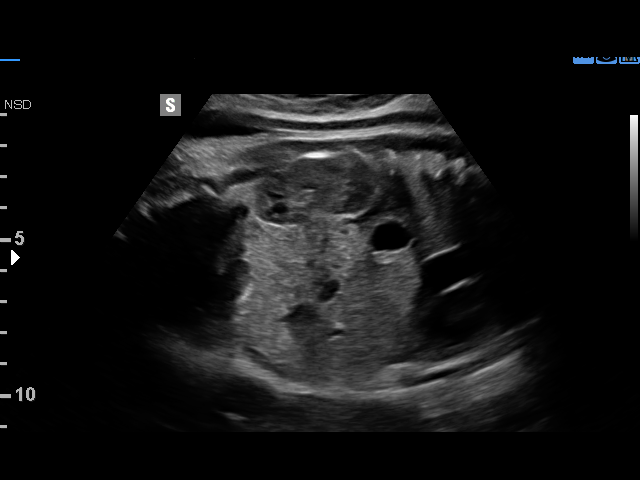

[11 of 11 positions shown; findings below may reference images not displayed]

[facility]yn Clinic
[REDACTED]-
Faculty Physician

1  JAYDEEP MCGUIGAN            [id number]      [phone]     [phone]
Indications

32 weeks gestation of pregnancy
Advanced maternal age multigravida 35+,
third trimester (Low risk NIPS)
Poor obstetrical history (H/O DVT
postpartum)
OB History

Gravidity:    6         Term:   5
Living:       5
Fetal Evaluation

Num Of Fetuses:     1
Fetal Heart         144
Rate(bpm):
Cardiac Activity:   Observed
Presentation:       Cephalic
Placenta:           Posterior, above cervical os
P. Cord Insertion:  Previously Visualized

Amniotic Fluid
AFI FV:      Subjectively within normal limits

AFI Sum(cm)     %Tile       Largest Pocket(cm)
15.27           54

RUQ(cm)       RLQ(cm)       LUQ(cm)        LLQ(cm)
5.71
Biophysical Evaluation

Amniotic F.V:   Pocket => 2 cm two         F. Tone:        Observed
planes
F. Movement:    Observed                   Score:          [DATE]
F. Breathing:   Observed
Gestational Age

LMP:           32w 1d       Date:   12/23/14                 EDD:   09/29/15
Best:          32w 1d    Det. By:   LMP  (12/23/14)          EDD:   09/29/15
Impression

Single IUP at 32w 1d
Advanced maternal age > 40, hx of DVT
BPP [DATE]
Normal amniotic fluid volume
Recommendations

Recommend antenatal testing (2x weekly NSTs with weekly
AFIs or weekly BPPs)
Ultrasound for growth in 3 weeks

## 2016-11-25 IMAGING — US US MFM OB FOLLOW-UP
1 series · 14 of 27 positions shown · non-contrast
Comparison: none

[Series 1: us mfm ob follow-up · 27 acquisitions, 14 frames shown]
[im 1/27]
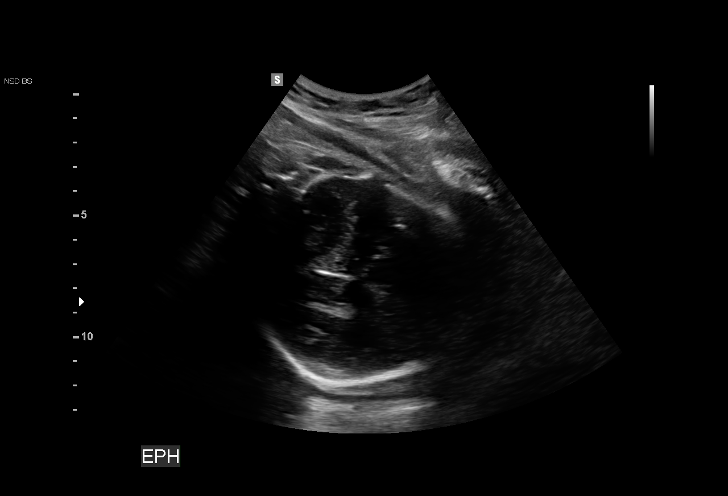
[im 3/27]
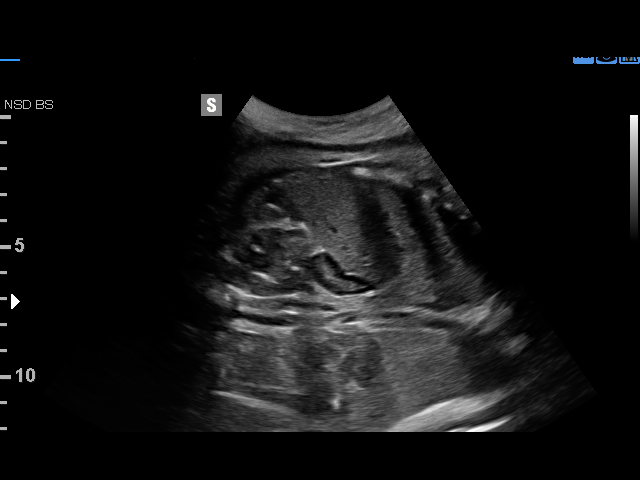
[im 5/27]
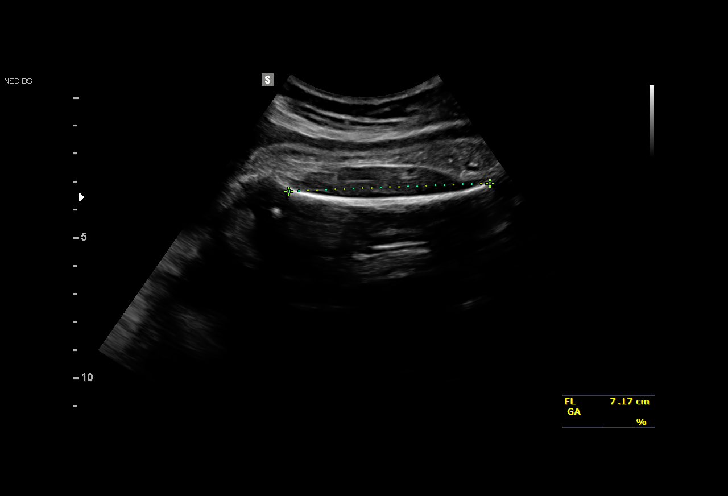
[im 7/27]
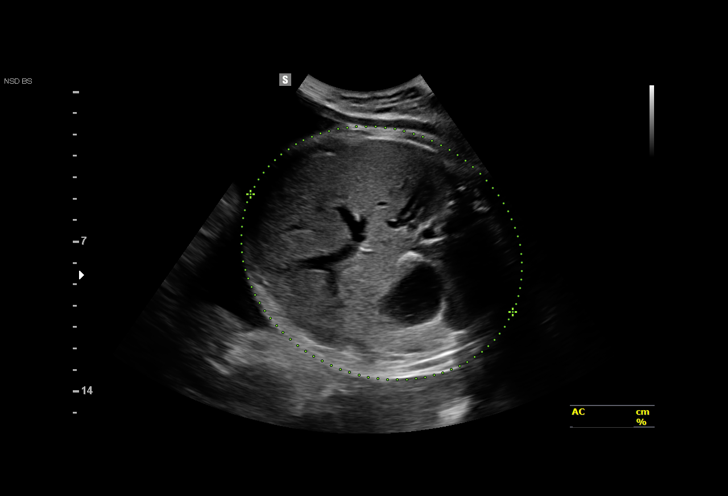
[im 9/27]
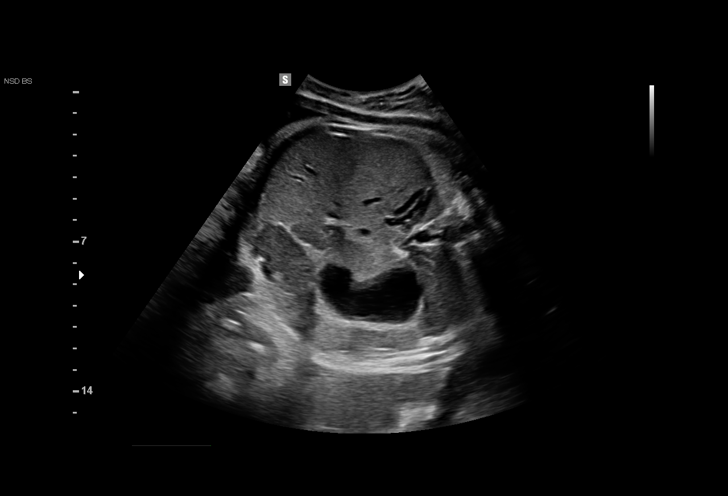
[im 11/27]
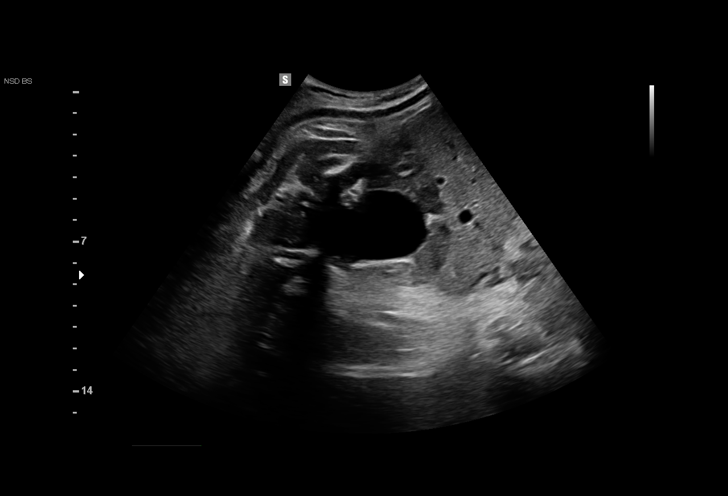
[im 13/27]
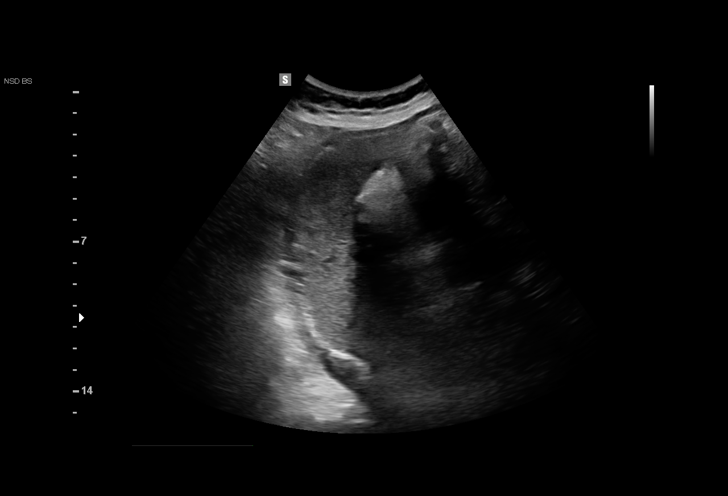
[im 15/27]
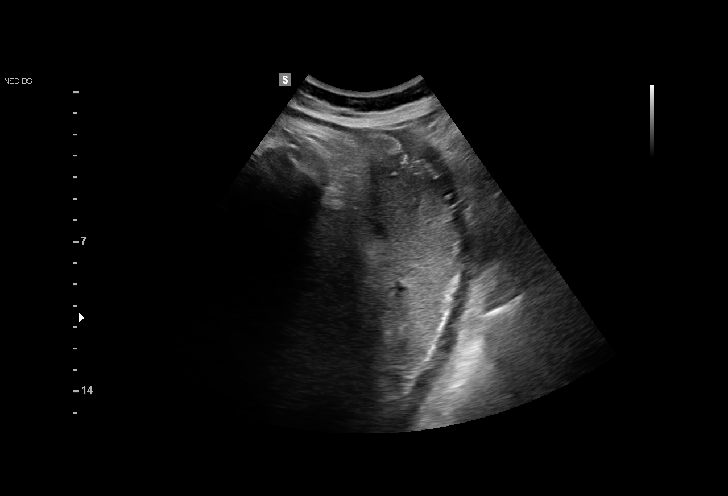
[im 17/27]
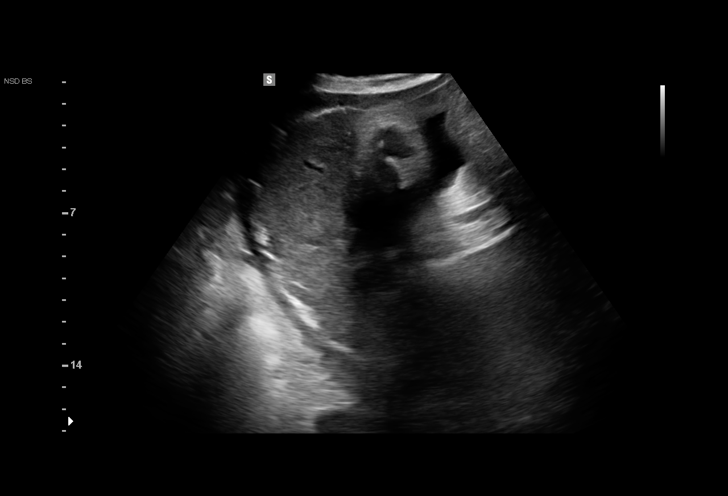
[im 19/27]
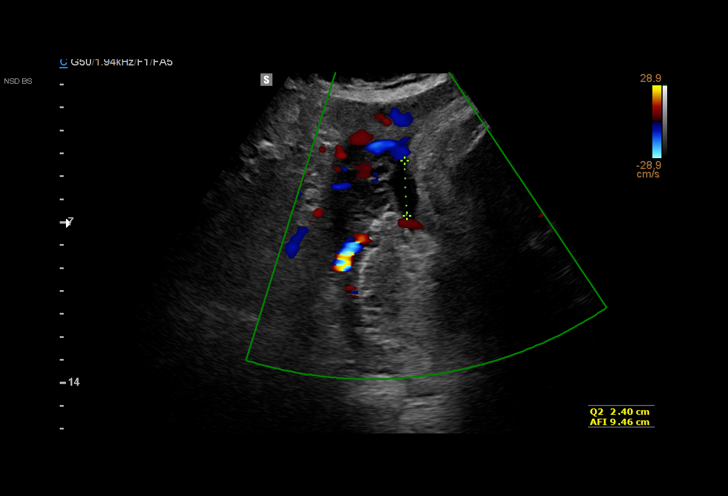
[im 21/27]
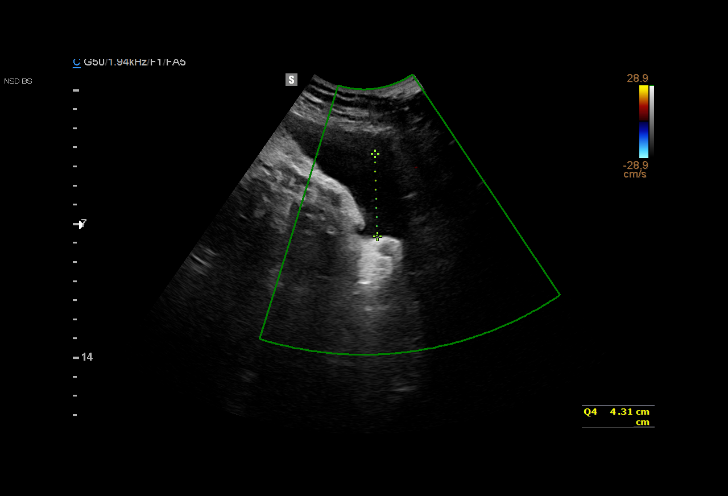
[im 23/27]
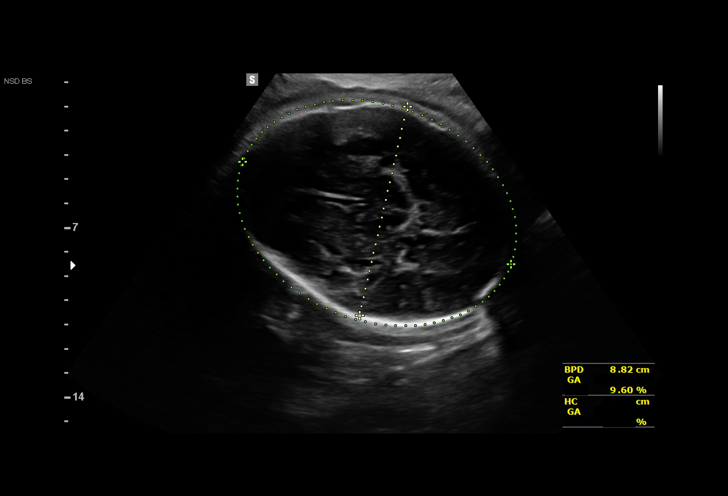
[im 25/27]
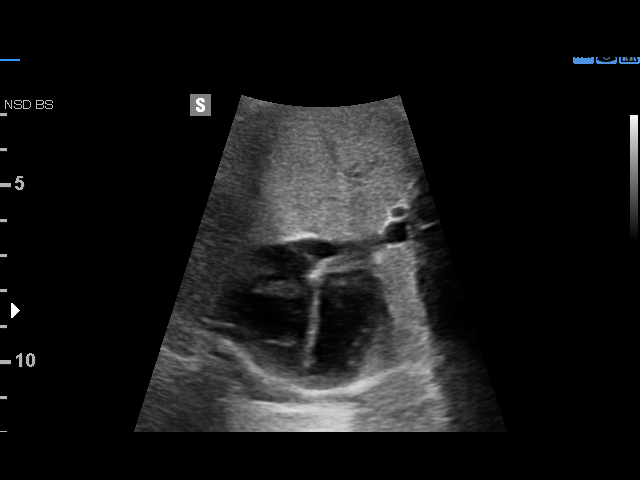
[im 27/27]
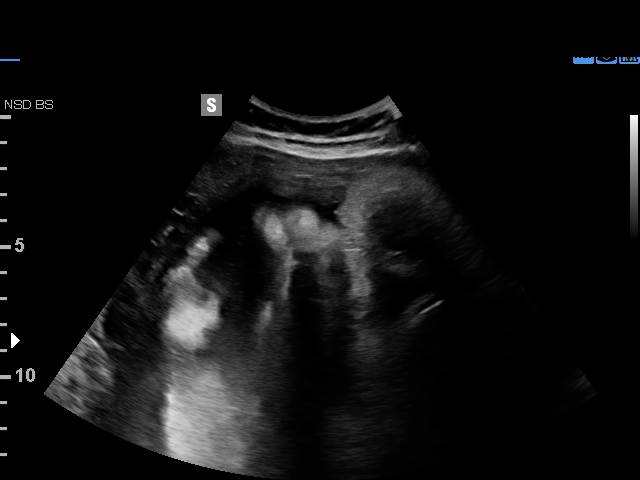

[14 of 27 positions shown; findings below may reference images not displayed]

OB[facility]yn Clinic
[REDACTED]-
Faculty Physician

1  FIOFOR BABCINSCHI             [id number]      [id number]     [phone]
2  DONNY LAO           [id number]      [phone]     [phone]
Indications

38 weeks gestation of pregnancy
Advanced maternal age multigravida 35+,
third trimester (Low risk NIPS)
Poor obstetrical history (H/O DVT
postpartum); on Oniga Lovenox
OB History

Gravidity:    6         Term:   5
Living:       5
Fetal Evaluation

Num Of Fetuses:     1
Cardiac Activity:   Observed
Presentation:       Cephalic
Placenta:           Posterior, above cervical os
P. Cord Insertion:  Previously Visualized

Amniotic Fluid
AFI FV:      Subjectively within normal limits

AFI Sum(cm)     %Tile       Largest Pocket(cm)
17.8            71

RUQ(cm)       RLQ(cm)       LUQ(cm)        LLQ(cm)
7.05
Biophysical Evaluation

Amniotic F.V:   Pocket => 2 cm two         F. Tone:        Observed
planes
F. Movement:    Observed                   Score:          [DATE]
F. Breathing:   Observed
Biometry

BPD:      88.5  mm     G. Age:  35w 6d         11  %    CI:        73.34   %   70 - 86
FL/HC:      21.8   %   20.9 -
HC:      328.4  mm     G. Age:  37w 2d         13  %    HC/AC:      0.86       0.92 -
AC:      383.1  mm     G. Age:  N/A          > 97  %    FL/BPD:     81.0   %   71 - 87
FL:       71.7  mm     G. Age:  36w 5d         16  %    FL/AC:      18.7   %   20 - 24

Est. FW:    1778  gm      8 lb 9 oz   > 90  %
Gestational Age

LMP:           38w 3d       Date:   12/23/14                 EDD:   09/29/15
U/S Today:     36w 4d                                        EDD:   10/12/15
Best:          38w 3d    Det. By:   LMP  (12/23/14)          EDD:   09/29/15
Anatomy

Cranium:               Appears normal         LVOT:                   Previously seen
Cavum:                 Previously seen        Aortic Arch:            Previously seen
Ventricles:            Previously seen        Ductal Arch:            Previously seen
Choroid Plexus:        Previously seen        Diaphragm:              Appears normal
Cerebellum:            Previously seen        Stomach:                Appears normal, left
sided
Posterior Fossa:       Previously seen        Abdomen:                Appears normal
Nuchal Fold:           Not applicable (>20    Abdominal Wall:         Previously seen
wks GA)
Face:                  Orbits and profile     Cord Vessels:           Previously seen
previously seen
Lips:                  Previously seen        Kidneys:                Appear normal
Palate:                Previously seen        Bladder:                Appears normal
Thoracic:              Appears normal         Spine:                  Previously seen
Heart:                 Appears normal         Upper Extremities:      Previously seen
(4CH, axis, and situs
RVOT:                  Previously seen        Lower Extremities:      Previously seen

Other:  Female gender previously seen. Heels prev. visualized. Technically
difficult due to fetal position.
Cervix Uterus Adnexa

Cervix
Not visualized (advanced GA >63wks)
Impression

SIUP at 38+3 weeks
Cephalic presentation
Normal interval anatomy; anatomic survey complete
Normal amniotic fluid volume
EFW > 90th %tile; AC > 97th %tile; 1778 grams; 8+9; fetus at
risk to be LGA/macrosomic
BPP [DATE]
Recommendations

Follow-up as clinically indicated

## 2018-09-26 ENCOUNTER — Ambulatory Visit (HOSPITAL_COMMUNITY)
Admission: EM | Admit: 2018-09-26 | Discharge: 2018-09-26 | Disposition: A | Discharge status: Home / Self Care | Payer: Medicaid Other | Attending: Urgent Care | Admitting: Urgent Care

## 2018-09-26 ENCOUNTER — Ambulatory Visit (INDEPENDENT_AMBULATORY_CARE_PROVIDER_SITE_OTHER): Payer: Medicaid Other

## 2018-09-26 ENCOUNTER — Encounter (HOSPITAL_COMMUNITY): Payer: Self-pay | Admitting: Emergency Medicine

## 2018-09-26 ENCOUNTER — Other Ambulatory Visit: Payer: Self-pay

## 2018-09-26 DIAGNOSIS — R200 Anesthesia of skin: Secondary | ICD-10-CM | POA: Insufficient documentation

## 2018-09-26 DIAGNOSIS — R0789 Other chest pain: Secondary | ICD-10-CM

## 2018-09-26 DIAGNOSIS — R5381 Other malaise: Secondary | ICD-10-CM

## 2018-09-26 DIAGNOSIS — R0602 Shortness of breath: Secondary | ICD-10-CM

## 2018-09-26 DIAGNOSIS — J4530 Mild persistent asthma, uncomplicated: Secondary | ICD-10-CM | POA: Diagnosis not present

## 2018-09-26 DIAGNOSIS — R072 Precordial pain: Secondary | ICD-10-CM | POA: Diagnosis not present

## 2018-09-26 DIAGNOSIS — R52 Pain, unspecified: Secondary | ICD-10-CM

## 2018-09-26 DIAGNOSIS — R070 Pain in throat: Secondary | ICD-10-CM

## 2018-09-26 DIAGNOSIS — D6859 Other primary thrombophilia: Secondary | ICD-10-CM

## 2018-09-26 DIAGNOSIS — Z86718 Personal history of other venous thrombosis and embolism: Secondary | ICD-10-CM

## 2018-09-26 DIAGNOSIS — K1379 Other lesions of oral mucosa: Secondary | ICD-10-CM | POA: Diagnosis not present

## 2018-09-26 DIAGNOSIS — R5383 Other fatigue: Secondary | ICD-10-CM

## 2018-09-26 DIAGNOSIS — R062 Wheezing: Secondary | ICD-10-CM

## 2018-09-26 DIAGNOSIS — R252 Cramp and spasm: Secondary | ICD-10-CM | POA: Diagnosis not present

## 2018-09-26 DIAGNOSIS — Z20828 Contact with and (suspected) exposure to other viral communicable diseases: Secondary | ICD-10-CM | POA: Insufficient documentation

## 2018-09-26 MED ORDER — CHLORHEXIDINE GLUCONATE 0.12 % MT SOLN: OROMUCOSAL | 0 refills | Status: AC

## 2018-09-26 NOTE — ED Triage Notes (Signed)
PT has a sore in mouth, burning with spicy food. Gums burning.  Fever, weakeness, tired, muscle pain, itching eyes.   Has been going on for 3 months.

## 2018-09-26 NOTE — ED Triage Notes (Signed)
Right arm and hand are swollen with tingling sensation.
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
## 2018-09-30 LAB — NOVEL CORONAVIRUS, NAA (HOSP ORDER, SEND-OUT TO REF LAB; TAT 18-24 HRS): SARS-CoV-2, NAA: NOT DETECTED

## 2018-10-03 ENCOUNTER — Encounter (HOSPITAL_COMMUNITY): Payer: Self-pay | Admitting: Urgent Care

## 2018-11-22 ENCOUNTER — Telehealth: Payer: Self-pay | Admitting: Obstetrics & Gynecology

## 2018-11-22 NOTE — Telephone Encounter (Signed)
Attempted to call patient w/ Nepali interpreter ID# [id number] about her appointment on 9/25 @ 8:15. No answer, interpreter left voicemail instructing patient to wear a face mask for the entire appointment and no visitors are allowed during the visit. Patient instructed not to attend the appointment if she was any symptoms. Symptom list and office number left.

## 2018-11-23 ENCOUNTER — Ambulatory Visit: Payer: Medicaid Other | Admitting: Obstetrics & Gynecology

## 2019-01-02 ENCOUNTER — Encounter: Payer: Self-pay | Admitting: Obstetrics & Gynecology

## 2019-01-02 ENCOUNTER — Other Ambulatory Visit (HOSPITAL_COMMUNITY)
Admission: RE | Admit: 2019-01-02 | Discharge: 2019-01-02 | Disposition: A | Discharge status: Home / Self Care | Payer: Medicaid Other | Source: Ambulatory Visit | Attending: Obstetrics & Gynecology | Admitting: Obstetrics & Gynecology

## 2019-01-02 ENCOUNTER — Other Ambulatory Visit: Payer: Self-pay

## 2019-01-02 ENCOUNTER — Ambulatory Visit (INDEPENDENT_AMBULATORY_CARE_PROVIDER_SITE_OTHER): Payer: Medicaid Other | Admitting: Obstetrics & Gynecology

## 2019-01-02 VITALS — BP 136/78 | HR 81 | Wt 149.2 lb

## 2019-01-02 DIAGNOSIS — Z3046 Encounter for surveillance of implantable subdermal contraceptive: Secondary | ICD-10-CM

## 2019-01-02 DIAGNOSIS — Z0000 Encounter for general adult medical examination without abnormal findings: Secondary | ICD-10-CM

## 2019-01-02 DIAGNOSIS — Z01419 Encounter for gynecological examination (general) (routine) without abnormal findings: Secondary | ICD-10-CM

## 2019-01-02 LAB — POCT PREGNANCY, URINE: Preg Test, Ur: NEGATIVE

## 2019-01-02 MED ORDER — ETONOGESTREL 68 MG ~~LOC~~ IMPL
68.0000 mg | DRUG_IMPLANT | Freq: Once | SUBCUTANEOUS | Status: AC
  Administered 2019-01-02: 68 mg via SUBCUTANEOUS

## 2019-01-02 NOTE — Patient Instructions (Signed)
Nexplanon Instructions After Insertion   Keep bandage clean and dry for 24 hours   May use ice/Tylenol/Ibuprofen for soreness or pain   If you develop fever, drainage or increased warmth from incision site-contact office immediately   Preventive Care 45-45 Years Old, Female Preventive care refers to visits with your health care provider and lifestyle choices that can promote health and wellness. This includes:  A yearly physical exam. This may also be called an annual well check.  Regular dental visits and eye exams.  Immunizations.  Screening for certain conditions.  Healthy lifestyle choices, such as eating a healthy diet, getting regular exercise, not using drugs or products that contain nicotine and tobacco, and limiting alcohol use. What can I expect for my preventive care visit? Physical exam Your health care provider will check your:  Height and weight. This may be used to calculate body mass index (BMI), which tells if you are at a healthy weight.  Heart rate and blood pressure.  Skin for abnormal spots. Counseling Your health care provider may ask you questions about your:  Alcohol, tobacco, and drug use.  Emotional well-being.  Home and relationship well-being.  Sexual activity.  Eating habits.  Work and work Statistician.  Method of birth control.  Menstrual cycle.  Pregnancy history. What immunizations do I need?  Influenza (flu) vaccine  This is recommended every year. Tetanus, diphtheria, and pertussis (Tdap) vaccine  You may need a Td booster every 10 years. Varicella (chickenpox) vaccine  You may need this if you have not been vaccinated. Zoster (shingles) vaccine  You may need this after age 35. Measles, mumps, and rubella (MMR) vaccine  You may need at least one dose of MMR if you were born in 1957 or later. You may also need a second dose. Pneumococcal conjugate (PCV13) vaccine  You may need this if you have certain conditions  and were not previously vaccinated. Pneumococcal polysaccharide (PPSV23) vaccine  You may need one or two doses if you smoke cigarettes or if you have certain conditions. Meningococcal conjugate (MenACWY) vaccine  You may need this if you have certain conditions. Hepatitis A vaccine  You may need this if you have certain conditions or if you travel or work in places where you may be exposed to hepatitis A. Hepatitis B vaccine  You may need this if you have certain conditions or if you travel or work in places where you may be exposed to hepatitis B. Haemophilus influenzae type b (Hib) vaccine  You may need this if you have certain conditions. Human papillomavirus (HPV) vaccine  If recommended by your health care provider, you may need three doses over 6 months. You may receive vaccines as individual doses or as more than one vaccine together in one shot (combination vaccines). Talk with your health care provider about the risks and benefits of combination vaccines. What tests do I need? Blood tests  Lipid and cholesterol levels. These may be checked every 5 years, or more frequently if you are over 89 years old.  Hepatitis C test.  Hepatitis B test. Screening  Lung cancer screening. You may have this screening every year starting at age 45 if you have a 30-pack-year history of smoking and currently smoke or have quit within the past 15 years.  Colorectal cancer screening. All adults should have this screening starting at age 64 and continuing until age 61. Your health care provider may recommend screening at age 71 if you are at increased risk. You will  have tests every 1-10 years, depending on your results and the type of screening test.  Diabetes screening. This is done by checking your blood sugar (glucose) after you have not eaten for a while (fasting). You may have this done every 1-3 years.  Mammogram. This may be done every 1-2 years. Talk with your health care provider  about when you should start having regular mammograms. This may depend on whether you have a family history of breast cancer.  BRCA-related cancer screening. This may be done if you have a family history of breast, ovarian, tubal, or peritoneal cancers.  Pelvic exam and Pap test. This may be done every 3 years starting at age 13. Starting at age 58, this may be done every 5 years if you have a Pap test in combination with an HPV test. Other tests  Sexually transmitted disease (STD) testing.  Bone density scan. This is done to screen for osteoporosis. You may have this scan if you are at high risk for osteoporosis. Follow these instructions at home: Eating and drinking  Eat a diet that includes fresh fruits and vegetables, whole grains, lean protein, and low-fat dairy.  Take vitamin and mineral supplements as recommended by your health care provider.  Do not drink alcohol if: ? Your health care provider tells you not to drink. ? You are pregnant, may be pregnant, or are planning to become pregnant.  If you drink alcohol: ? Limit how much you have to 0-1 drink a day. ? Be aware of how much alcohol is in your drink. In the [place] one drink equals one 12 oz bottle of beer (355 mL), one 5 oz glass of wine (148 mL), or one 1 oz glass of hard liquor (44 mL). Lifestyle  Take daily care of your teeth and gums.  Stay active. Exercise for at least 30 minutes on 5 or more days each week.  Do not use any products that contain nicotine or tobacco, such as cigarettes, e-cigarettes, and chewing tobacco. If you need help quitting, ask your health care provider.  If you are sexually active, practice safe sex. Use a condom or other form of birth control (contraception) in order to prevent pregnancy and STIs (sexually transmitted infections).  If told by your health care provider, take low-dose aspirin daily starting at age 66. What's next?  Visit your health care provider once a year for a well  check visit.  Ask your health care provider how often you should have your eyes and teeth checked.  Stay up to date on all vaccines. This information is not intended to replace advice given to you by your health care provider. Make sure you discuss any questions you have with your health care provider. Document Released: 03/13/2015 Document Revised: 10/26/2017 Document Reviewed: 10/26/2017 Elsevier Patient Education  2020 [employer].
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
## 2019-01-07 LAB — CYTOLOGY - PAP
Comment: NEGATIVE
Diagnosis: NEGATIVE
High risk HPV: NEGATIVE

## 2019-01-29 ENCOUNTER — Encounter: Payer: Self-pay | Admitting: *Deleted
